# Patient Record
Sex: Female | Born: 1960 | Race: Black or African American | Hispanic: No | Smoking: Current some day smoker
Health system: Southern US, Community
[De-identification: ages and names within clinical notes are randomized; demographics above are authoritative.]

## PROBLEM LIST (undated history)

## (undated) DIAGNOSIS — J309 Allergic rhinitis, unspecified: Secondary | ICD-10-CM

## (undated) DIAGNOSIS — E7800 Pure hypercholesterolemia, unspecified: Secondary | ICD-10-CM

## (undated) DIAGNOSIS — F39 Unspecified mood [affective] disorder: Secondary | ICD-10-CM

## (undated) DIAGNOSIS — F329 Major depressive disorder, single episode, unspecified: Secondary | ICD-10-CM

## (undated) DIAGNOSIS — I10 Essential (primary) hypertension: Secondary | ICD-10-CM

## (undated) DIAGNOSIS — F32A Depression, unspecified: Secondary | ICD-10-CM

## (undated) HISTORY — PX: TUBAL LIGATION: SHX77

## (undated) HISTORY — PX: STERILIZATION: SHX533

## (undated) HISTORY — PX: CHOLECYSTECTOMY: SHX55

---

## 2013-10-26 ENCOUNTER — Emergency Department (HOSPITAL_COMMUNITY): Payer: Medicaid - Out of State

## 2013-10-26 ENCOUNTER — Emergency Department (HOSPITAL_COMMUNITY)
Admission: EM | Admit: 2013-10-26 | Discharge: 2013-10-26 | Disposition: A | Discharge status: Home / Self Care | Payer: Medicaid - Out of State | Attending: Emergency Medicine | Admitting: Emergency Medicine

## 2013-10-26 ENCOUNTER — Other Ambulatory Visit: Payer: Self-pay

## 2013-10-26 ENCOUNTER — Encounter (HOSPITAL_COMMUNITY): Payer: Self-pay | Admitting: Emergency Medicine

## 2013-10-26 DIAGNOSIS — Z720 Tobacco use: Secondary | ICD-10-CM | POA: Diagnosis not present

## 2013-10-26 DIAGNOSIS — E780 Pure hypercholesterolemia: Secondary | ICD-10-CM | POA: Diagnosis not present

## 2013-10-26 DIAGNOSIS — M7989 Other specified soft tissue disorders: Secondary | ICD-10-CM

## 2013-10-26 DIAGNOSIS — R079 Chest pain, unspecified: Secondary | ICD-10-CM | POA: Diagnosis not present

## 2013-10-26 DIAGNOSIS — Z791 Long term (current) use of non-steroidal anti-inflammatories (NSAID): Secondary | ICD-10-CM | POA: Diagnosis not present

## 2013-10-26 DIAGNOSIS — Z79899 Other long term (current) drug therapy: Secondary | ICD-10-CM | POA: Insufficient documentation

## 2013-10-26 DIAGNOSIS — Z7982 Long term (current) use of aspirin: Secondary | ICD-10-CM | POA: Insufficient documentation

## 2013-10-26 DIAGNOSIS — M79605 Pain in left leg: Secondary | ICD-10-CM

## 2013-10-26 DIAGNOSIS — Z7952 Long term (current) use of systemic steroids: Secondary | ICD-10-CM | POA: Diagnosis not present

## 2013-10-26 HISTORY — DX: Pure hypercholesterolemia, unspecified: E78.00

## 2013-10-26 LAB — CBC WITH DIFFERENTIAL/PLATELET
Basophils Absolute: 0 10*3/uL (ref 0.0–0.1)
Basophils Relative: 0 % (ref 0–1)
Eosinophils Absolute: 0.1 10*3/uL (ref 0.0–0.7)
Eosinophils Relative: 2 % (ref 0–5)
HCT: 36.6 % (ref 36.0–46.0)
Hemoglobin: 12.1 g/dL (ref 12.0–15.0)
Lymphocytes Relative: 23 % (ref 12–46)
Lymphs Abs: 1.9 10*3/uL (ref 0.7–4.0)
MCH: 31.5 pg (ref 26.0–34.0)
MCHC: 33.1 g/dL (ref 30.0–36.0)
MCV: 95.3 fL (ref 78.0–100.0)
Monocytes Absolute: 0.5 10*3/uL (ref 0.1–1.0)
Monocytes Relative: 6 % (ref 3–12)
Neutro Abs: 5.7 10*3/uL (ref 1.7–7.7)
Neutrophils Relative %: 69 % (ref 43–77)
Platelets: 188 10*3/uL (ref 150–400)
RBC: 3.84 MIL/uL — ABNORMAL LOW (ref 3.87–5.11)
RDW: 13.7 % (ref 11.5–15.5)
WBC: 8.1 10*3/uL (ref 4.0–10.5)

## 2013-10-26 LAB — BASIC METABOLIC PANEL
Anion gap: 12 (ref 5–15)
BUN: 11 mg/dL (ref 6–23)
CO2: 25 mEq/L (ref 19–32)
Calcium: 9.6 mg/dL (ref 8.4–10.5)
Chloride: 100 mEq/L (ref 96–112)
Creatinine, Ser: 0.68 mg/dL (ref 0.50–1.10)
GFR calc Af Amer: >90 mL/min (ref >90)
GFR calc non Af Amer: >90 mL/min (ref >90)
Glucose, Bld: 97 mg/dL (ref 70–99)
Potassium: 4.4 mEq/L (ref 3.7–5.3)
Sodium: 137 mEq/L (ref 137–147)

## 2013-10-26 LAB — TROPONIN I: Troponin I: <0.3 ng/mL (ref <0.30)

## 2013-10-26 MED ORDER — IBUPROFEN 200 MG PO TABS
600.0000 mg | ORAL_TABLET | Freq: Once | ORAL | Status: AC
  Administered 2013-10-26: 600 mg via ORAL
  Filled 2013-10-26: qty 3

## 2013-10-26 MED ORDER — OXYCODONE-ACETAMINOPHEN 5-325 MG PO TABS
2.0000 | ORAL_TABLET | Freq: Once | ORAL | Status: AC
  Administered 2013-10-26: 2 via ORAL
  Filled 2013-10-26: qty 2

## 2013-10-26 MED ORDER — TRAMADOL HCL 50 MG PO TABS: 50.0000 mg | ORAL_TABLET | Freq: Four times a day (QID) | ORAL | Status: DC | PRN

## 2013-10-26 MED ORDER — OXYCODONE-ACETAMINOPHEN 5-325 MG PO TABS: 1.0000 | ORAL_TABLET | ORAL | Status: DC | PRN

## 2013-10-26 NOTE — ED Notes (Addendum)
Pt c/o intermittent, sharp L side chest pain and LLE swelling/pain x 9 days and intermittent R side abdominal distention/pain x "a while."  Pain score 7/10.  Sts associated symptoms of SOB, dizziness, nausea, and back pain w/ chest pain.  Hx of smoking and high cholesterol.

## 2013-10-26 NOTE — Progress Notes (Signed)
VASCULAR LAB PRELIMINARY  PRELIMINARY  PRELIMINARY  PRELIMINARY  Left lower extremity venous duplex completed.    Preliminary report:  Left:  No evidence of DVT, superficial thrombosis, or Baker's cyst.  Gen Clagg, RVS 10/26/2013, 1:34 PM

## 2013-10-26 NOTE — Discharge Instructions (Signed)

## 2013-10-26 NOTE — ED Notes (Signed)
US at bedside

## 2013-10-26 NOTE — ED Notes (Signed)
AVS explained in detail. No other questions/concerns. Knows not to take extra tylenol/drink/operate heavy machinery with medications. Ambulatory and moving all extremities.

## 2013-10-26 NOTE — ED Notes (Signed)
Hooked up to EKG monitor and SpO2 monitor. A&Ox4. No other questions/concerns. Aware going to x-ray shortly.
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
## 2013-11-06 ENCOUNTER — Emergency Department (HOSPITAL_COMMUNITY)
Admission: EM | Admit: 2013-11-06 | Discharge: 2013-11-06 | Disposition: A | Discharge status: Home / Self Care | Payer: Medicaid - Out of State | Attending: Emergency Medicine | Admitting: Emergency Medicine

## 2013-11-06 ENCOUNTER — Encounter (HOSPITAL_COMMUNITY): Payer: Self-pay

## 2013-11-06 ENCOUNTER — Emergency Department (HOSPITAL_COMMUNITY): Payer: Medicaid - Out of State

## 2013-11-06 DIAGNOSIS — R079 Chest pain, unspecified: Secondary | ICD-10-CM

## 2013-11-06 DIAGNOSIS — Z720 Tobacco use: Secondary | ICD-10-CM | POA: Insufficient documentation

## 2013-11-06 DIAGNOSIS — Z8709 Personal history of other diseases of the respiratory system: Secondary | ICD-10-CM | POA: Insufficient documentation

## 2013-11-06 DIAGNOSIS — R110 Nausea: Secondary | ICD-10-CM | POA: Diagnosis not present

## 2013-11-06 DIAGNOSIS — Z8639 Personal history of other endocrine, nutritional and metabolic disease: Secondary | ICD-10-CM | POA: Insufficient documentation

## 2013-11-06 DIAGNOSIS — M7989 Other specified soft tissue disorders: Secondary | ICD-10-CM | POA: Diagnosis present

## 2013-11-06 DIAGNOSIS — I10 Essential (primary) hypertension: Secondary | ICD-10-CM | POA: Diagnosis not present

## 2013-11-06 DIAGNOSIS — Z8659 Personal history of other mental and behavioral disorders: Secondary | ICD-10-CM | POA: Insufficient documentation

## 2013-11-06 DIAGNOSIS — M25562 Pain in left knee: Secondary | ICD-10-CM | POA: Insufficient documentation

## 2013-11-06 DIAGNOSIS — R1084 Generalized abdominal pain: Secondary | ICD-10-CM | POA: Diagnosis not present

## 2013-11-06 DIAGNOSIS — R7889 Finding of other specified substances, not normally found in blood: Secondary | ICD-10-CM | POA: Diagnosis not present

## 2013-11-06 DIAGNOSIS — R7989 Other specified abnormal findings of blood chemistry: Secondary | ICD-10-CM

## 2013-11-06 DIAGNOSIS — R791 Abnormal coagulation profile: Secondary | ICD-10-CM

## 2013-11-06 HISTORY — DX: Pure hypercholesterolemia, unspecified: E78.00

## 2013-11-06 HISTORY — DX: Unspecified mood (affective) disorder: F39

## 2013-11-06 HISTORY — DX: Depression, unspecified: F32.A

## 2013-11-06 HISTORY — DX: Allergic rhinitis, unspecified: J30.9

## 2013-11-06 HISTORY — DX: Essential (primary) hypertension: I10

## 2013-11-06 HISTORY — DX: Major depressive disorder, single episode, unspecified: F32.9

## 2013-11-06 LAB — CBC WITH DIFFERENTIAL/PLATELET
BASOS ABS: 0 10*3/uL (ref 0.0–0.1)
BASOS PCT: 0 % (ref 0–1)
EOS PCT: 1 % (ref 0–5)
Eosinophils Absolute: 0.1 10*3/uL (ref 0.0–0.7)
HCT: 37 % (ref 36.0–46.0)
Hemoglobin: 11.9 g/dL — ABNORMAL LOW (ref 12.0–15.0)
LYMPHS PCT: 28 % (ref 12–46)
Lymphs Abs: 2.5 10*3/uL (ref 0.7–4.0)
MCH: 30.7 pg (ref 26.0–34.0)
MCHC: 32.2 g/dL (ref 30.0–36.0)
MCV: 95.4 fL (ref 78.0–100.0)
Monocytes Absolute: 0.7 10*3/uL (ref 0.1–1.0)
Monocytes Relative: 8 % (ref 3–12)
Neutro Abs: 5.6 10*3/uL (ref 1.7–7.7)
Neutrophils Relative %: 63 % (ref 43–77)
PLATELETS: 169 10*3/uL (ref 150–400)
RBC: 3.88 MIL/uL (ref 3.87–5.11)
RDW: 13.6 % (ref 11.5–15.5)
WBC: 8.9 10*3/uL (ref 4.0–10.5)

## 2013-11-06 LAB — I-STAT TROPONIN, ED: TROPONIN I, POC: 0 ng/mL (ref 0.00–0.08)

## 2013-11-06 LAB — BASIC METABOLIC PANEL
ANION GAP: 12 (ref 5–15)
BUN: 10 mg/dL (ref 6–23)
CALCIUM: 9.7 mg/dL (ref 8.4–10.5)
CO2: 26 meq/L (ref 19–32)
Chloride: 102 mEq/L (ref 96–112)
Creatinine, Ser: 0.68 mg/dL (ref 0.50–1.10)
GFR calc non Af Amer: >90 mL/min (ref >90)
Glucose, Bld: 88 mg/dL (ref 70–99)
Potassium: 4 mEq/L (ref 3.7–5.3)
Sodium: 140 mEq/L (ref 137–147)

## 2013-11-06 LAB — D-DIMER, QUANTITATIVE: D-Dimer, Quant: 0.61 ug/mL-FEU — ABNORMAL HIGH (ref 0.00–0.48)

## 2013-11-06 MED ORDER — ASPIRIN 81 MG PO CHEW: 324.0000 mg | CHEWABLE_TABLET | Freq: Once | ORAL | Status: DC

## 2013-11-06 MED ORDER — HYDROCODONE-ACETAMINOPHEN 5-325 MG PO TABS: 1.0000 | ORAL_TABLET | Freq: Four times a day (QID) | ORAL | Status: DC | PRN

## 2013-11-06 MED ORDER — ASPIRIN 81 MG PO CHEW
81.0000 mg | CHEWABLE_TABLET | Freq: Once | ORAL | Status: DC
  Filled 2013-11-06: qty 1

## 2013-11-06 MED ORDER — MORPHINE SULFATE 4 MG/ML IJ SOLN
4.0000 mg | Freq: Once | INTRAMUSCULAR | Status: AC
  Administered 2013-11-06: 4 mg via INTRAVENOUS
  Filled 2013-11-06: qty 1

## 2013-11-06 MED ORDER — ASPIRIN 81 MG PO CHEW
243.0000 mg | CHEWABLE_TABLET | Freq: Once | ORAL | Status: AC
  Administered 2013-11-06: 243 mg via ORAL

## 2013-11-06 NOTE — ED Notes (Signed)
Pt states she has swelling to her left leg since 2 weeks ago and felt a sharp pain go up to her chest earlier today.  Pain all the way up to above her knee. No SOB.

## 2013-11-06 NOTE — ED Provider Notes (Signed)
CSN: [id number]     Arrival date & time 11/06/13  1335 History   First MD Initiated Contact with Patient 11/06/13 1631     Chief Complaint  Patient presents with  . Leg Swelling     (Consider location/radiation/quality/duration/timing/severity/associated sxs/prior Treatment) HPI Comments: The patient is a 53 year old female past history of hypertension, bipolar, depression, hypercholesterolemia presents emergency room with chief complaint of left knee pain and swelling for 3 weeks. She also reports associated chest pain since prior to left knee pain and swelling. Patient reports chest pain since last night, all throughout the day. Denies specific aggravating factors including body position, exertion.  She reports pain lasting for seconds, intermittent that radiate from her knee down to her toes, up into her chest. No recent travel, family history or personal history of DVT/PE, cancer, or exogenous estrogen.   The history is provided by the patient. No language interpreter was used.    Past Medical History  Diagnosis Date  . Hypertension   . Hypercholesteremia   . Mood disorder   . Allergic rhinitis   . Depression    Past Surgical History  Procedure Laterality Date  . Sterilization     No family history on file. History  Substance Use Topics  . Smoking status: Current Some Day Smoker  . Smokeless tobacco: Not on file  . Alcohol Use: Yes     Comment: occas   OB History    No data available     Review of Systems  Constitutional: Negative for fever, chills and diaphoresis.  Respiratory: Negative for cough, shortness of breath and wheezing.   Cardiovascular: Positive for chest pain and leg swelling.  Gastrointestinal: Positive for nausea and abdominal pain. Negative for vomiting and diarrhea.       Abdominal pain for months.  Musculoskeletal: Positive for arthralgias.      Allergies  Seroquel and Tramadol  Home Medications   Prior to Admission medications   Not on  File   BP 113/69 mmHg  Pulse 89  Temp(Src) 97.9 F (36.6 C) (Oral)  Resp 19  Ht 5\' 4"  (1.626 m)  Wt 145 lb (65.772 kg)  BMI 24.88 kg/m2  SpO2 97%  LMP 09/30/2013 Physical Exam  Constitutional: She is oriented to person, place, and time. She appears well-developed and well-nourished.  Non-toxic appearance. She does not have a sickly appearance. She does not appear ill. No distress.  HENT:  Head: Normocephalic and atraumatic.  Neck: Neck supple.  Cardiovascular: Normal rate and regular rhythm.   No lower extremity edema, Negative Homans' sign.  Pulmonary/Chest: Effort normal. No respiratory distress. She has no wheezes. She has no rales. She exhibits tenderness.    Patient is able to speak in complete sentences.   Abdominal: Soft. She exhibits no distension and no mass. There is generalized tenderness. There is no rebound and no guarding.  Minimal generalized tenderness.  Musculoskeletal:       Left knee: She exhibits no swelling and no effusion. Tenderness found.  Generalized knee tenderness with palpation.  Neurological: She is alert and oriented to person, place, and time.  Skin: Skin is warm and dry. She is not diaphoretic.  Psychiatric: She has a normal mood and affect. Her behavior is normal.  Nursing note and vitals reviewed.   ED Course  Procedures (including critical care time) Labs Review Labs Reviewed  CBC WITH DIFFERENTIAL - Abnormal; Notable for the following:    Hemoglobin 11.9 (*)    All other components within normal  limits  D-DIMER, QUANTITATIVE - Abnormal; Notable for the following:    D-Dimer, Quant 0.61 (*)    All other components within normal limits  BASIC METABOLIC PANEL  Randolm Idol, ED    Imaging Review Dg Chest 2 View  11/06/2013   CLINICAL DATA:  Chest pain.  EXAM: CHEST  2 VIEW  COMPARISON:  None.  FINDINGS: The heart size and mediastinal contours are within normal limits. Both lungs are clear. The visualized skeletal structures are  unremarkable.  IMPRESSION: No active cardiopulmonary disease.   Electronically Signed   By: Earle Gell M.D.   On: 11/06/2013 17:59     EKG Interpretation   Date/Time:  Tuesday November 06 2013 16:44:49 EST Ventricular Rate:  80 PR Interval:  179 QRS Duration: 82 QT Interval:  345 QTC Calculation: 398 R Axis:   58 Text Interpretation:  Sinus rhythm Minimal ST elevation, inferior leads  normal ST segment. Slight PR depression inferiorly. Borderline EKG.  Confirmed by Johnney Killian, MD, Jeannie Done [phone]) on 11/06/2013 4:50:46 PM      MDM   Final diagnoses:  Chest pain  Left knee pain  Elevated d-dimer   Patient presents with left knee pain and chest pain. Patient reports lower extremity edema, no edema or calf tenderness on exam. Chest pain is reproducible with palpation of chest wall. Doubt ACS. Borderline EKG, negative chest x-ray CBC, BMP without concerning abnormalities. D-dimer was slightly positive, 0.61, ultrasound of lower extremity was negative for a DVT. I doubt PE at this time, chest pain is reproducible with palpation of chest. Patient was 98% on room air with ambulation. Dr. Johnney Killian also evaluated the patient during this encounter and agrees likely musculoskeletal with chest discomfort and knee discomfort reproducible. Plan to have the patient follow up with orthopedist, knee sleeve ordered. Patient is on anti-inflammatory. Advised continued use, Norco for acute pain. Meds given in ED:  Medications  morphine 4 MG/ML injection 4 mg (4 mg Intravenous Given 11/06/13 1710)  aspirin chewable tablet 243 mg (243 mg Oral Given 11/06/13 1712)    New Prescriptions   HYDROCODONE-ACETAMINOPHEN (NORCO/VICODIN) 5-325 MG PER TABLET    Take 1 tablet by mouth every 6 (six) hours as needed for moderate pain or severe pain.    Harvie Heck, PA-C 11/07/13 [phone]

## 2013-11-06 NOTE — Progress Notes (Signed)
*  Preliminary Results* Left lower extremity venous duplex completed. Left lower extremity is negative for deep vein thrombosis. There is no evidence of left Baker's cyst.  11/06/2013 5:55 PM  Maudry Mayhew, RVT, RDCS, RDMS

## 2013-11-06 NOTE — ED Notes (Signed)
Pt O2 was 98% while walking.

## 2013-11-06 NOTE — Discharge Instructions (Signed)
Call for a follow up appointment with a Family or Primary Care Provider.  Call an orthopedic doctor for further evaluation of your knee pain. Return if Symptoms worsen.   Take medication as prescribed.  Continue to take your CELEBREX for your knee pain and chest wall pain.  If you do not take these, you can take Ibuprofen or another NSAIDs. Ice your knee 3 times a day. Elevate above your heart to decrease pain and swelling.

## 2013-11-07 ENCOUNTER — Encounter (HOSPITAL_COMMUNITY): Payer: Self-pay | Admitting: Emergency Medicine

## 2014-04-05 ENCOUNTER — Emergency Department (HOSPITAL_COMMUNITY)
Admission: EM | Admit: 2014-04-05 | Discharge: 2014-04-05 | Disposition: A | Discharge status: Home / Self Care | Payer: Medicaid - Out of State | Attending: Emergency Medicine | Admitting: Emergency Medicine

## 2014-04-05 ENCOUNTER — Emergency Department (HOSPITAL_COMMUNITY): Payer: Medicaid - Out of State

## 2014-04-05 ENCOUNTER — Encounter (HOSPITAL_COMMUNITY): Payer: Self-pay | Admitting: Emergency Medicine

## 2014-04-05 DIAGNOSIS — Z8709 Personal history of other diseases of the respiratory system: Secondary | ICD-10-CM | POA: Insufficient documentation

## 2014-04-05 DIAGNOSIS — I10 Essential (primary) hypertension: Secondary | ICD-10-CM | POA: Diagnosis not present

## 2014-04-05 DIAGNOSIS — E780 Pure hypercholesterolemia: Secondary | ICD-10-CM | POA: Insufficient documentation

## 2014-04-05 DIAGNOSIS — F329 Major depressive disorder, single episode, unspecified: Secondary | ICD-10-CM | POA: Diagnosis not present

## 2014-04-05 DIAGNOSIS — Z79899 Other long term (current) drug therapy: Secondary | ICD-10-CM | POA: Insufficient documentation

## 2014-04-05 DIAGNOSIS — Z791 Long term (current) use of non-steroidal anti-inflammatories (NSAID): Secondary | ICD-10-CM | POA: Insufficient documentation

## 2014-04-05 DIAGNOSIS — Z7982 Long term (current) use of aspirin: Secondary | ICD-10-CM | POA: Insufficient documentation

## 2014-04-05 DIAGNOSIS — K319 Disease of stomach and duodenum, unspecified: Secondary | ICD-10-CM

## 2014-04-05 DIAGNOSIS — Z7951 Long term (current) use of inhaled steroids: Secondary | ICD-10-CM | POA: Diagnosis not present

## 2014-04-05 DIAGNOSIS — K279 Peptic ulcer, site unspecified, unspecified as acute or chronic, without hemorrhage or perforation: Secondary | ICD-10-CM | POA: Diagnosis not present

## 2014-04-05 DIAGNOSIS — Z720 Tobacco use: Secondary | ICD-10-CM | POA: Diagnosis not present

## 2014-04-05 DIAGNOSIS — K30 Functional dyspepsia: Secondary | ICD-10-CM

## 2014-04-05 DIAGNOSIS — R079 Chest pain, unspecified: Secondary | ICD-10-CM | POA: Diagnosis present

## 2014-04-05 LAB — HEPATIC FUNCTION PANEL
ALT: 26 U/L (ref 0–35)
AST: 25 U/L (ref 0–37)
Albumin: 4.1 g/dL (ref 3.5–5.2)
Alkaline Phosphatase: 81 U/L (ref 39–117)
Bilirubin, Direct: <0.1 mg/dL (ref 0.0–0.5)
Total Bilirubin: 0.8 mg/dL (ref 0.3–1.2)
Total Protein: 7.8 g/dL (ref 6.0–8.3)

## 2014-04-05 LAB — CBC
HEMATOCRIT: 37 % (ref 36.0–46.0)
HEMOGLOBIN: 12 g/dL (ref 12.0–15.0)
MCH: 31.2 pg (ref 26.0–34.0)
MCHC: 32.4 g/dL (ref 30.0–36.0)
MCV: 96.1 fL (ref 78.0–100.0)
Platelets: 163 10*3/uL (ref 150–400)
RBC: 3.85 MIL/uL — ABNORMAL LOW (ref 3.87–5.11)
RDW: 13.9 % (ref 11.5–15.5)
WBC: 9.2 10*3/uL (ref 4.0–10.5)

## 2014-04-05 LAB — I-STAT TROPONIN, ED: TROPONIN I, POC: 0 ng/mL (ref 0.00–0.08)

## 2014-04-05 LAB — BASIC METABOLIC PANEL
Anion gap: 11 (ref 5–15)
BUN: 11 mg/dL (ref 6–23)
CO2: 26 mmol/L (ref 19–32)
Calcium: 10.1 mg/dL (ref 8.4–10.5)
Chloride: 104 mmol/L (ref 96–112)
Creatinine, Ser: 0.71 mg/dL (ref 0.50–1.10)
Glucose, Bld: 127 mg/dL — ABNORMAL HIGH (ref 70–99)
POTASSIUM: 3.7 mmol/L (ref 3.5–5.1)
SODIUM: 141 mmol/L (ref 135–145)

## 2014-04-05 LAB — I-STAT BETA HCG BLOOD, ED (MC, WL, AP ONLY): I-stat hCG, quantitative: <5 m[IU]/mL (ref <5)

## 2014-04-05 LAB — LIPASE, BLOOD: Lipase: 41 U/L (ref 11–59)

## 2014-04-05 LAB — BRAIN NATRIURETIC PEPTIDE: B Natriuretic Peptide: 26 pg/mL (ref 0.0–100.0)

## 2014-04-05 MED ORDER — GI COCKTAIL ~~LOC~~
30.0000 mL | Freq: Once | ORAL | Status: AC
  Administered 2014-04-05: 30 mL via ORAL
  Filled 2014-04-05: qty 30

## 2014-04-05 MED ORDER — OMEPRAZOLE 20 MG PO CPDR: 20.0000 mg | DELAYED_RELEASE_CAPSULE | Freq: Every day | ORAL | Status: DC

## 2014-04-05 MED ORDER — IOHEXOL 300 MG/ML  SOLN
100.0000 mL | Freq: Once | INTRAMUSCULAR | Status: AC | PRN
  Administered 2014-04-05: 100 mL via INTRAVENOUS

## 2014-04-05 NOTE — ED Provider Notes (Signed)
  Face-to-face evaluation   History: She has had some pain in her left chest, and left upper quadrant for one week. She also has some right lower quadrant abdominal pain. She complains of gaining weight. She drinks alcohol, fairly heavily.  Physical exam: Alert, calm, cooperative. Upper abdomen is nontender. When evaluated at 20:50. There is no right upper quadrant tenderness or mass.  20:50- all placed to GI, to discuss follow-up for abnormal CT results indicating duct swelling associated with hepatic drainage pathway.  I discussed case with Dr. Carlean Purl, he will see the patient is needed in the office.  Dg Chest 2 View  04/05/2014   CLINICAL DATA:  Left-sided chest pain and night sweats  EXAM: CHEST  2 VIEW  COMPARISON:  October 26, 2013  FINDINGS: Lungs are clear. Heart size and pulmonary vascularity are normal. No adenopathy. No pneumothorax. There is lower thoracic levoscoliosis.  IMPRESSION: No edema or consolidation.   Electronically Signed   By: Lowella Grip III M.D.   On: 04/05/2014 14:22   Ct Abdomen Pelvis W Contrast  04/05/2014   CLINICAL DATA:  Bloating and right-sided abdominal tenderness for 2 weeks. No nausea or vomiting.  EXAM: CT ABDOMEN AND PELVIS WITH CONTRAST  TECHNIQUE: Multidetector CT imaging of the abdomen and pelvis was performed using the standard protocol following bolus administration of intravenous contrast.  CONTRAST:  131mL OMNIPAQUE IOHEXOL 300 MG/ML  SOLN  COMPARISON:  None.  FINDINGS: Lower chest: There is a calcified granuloma in the left lower lobe base. No significant abnormality is evident.  Hepatobiliary: There is mild generalized fatty infiltration of the liver. There is no focal liver lesion. Gallbladder appears unremarkable. There is moderate dilatation of the extrahepatic bile ducts, up to 9 mm, without visible obstructing stone or mass. There also is moderate dilatation of the pancreatic duct up to 5 mm in the pancreatic head and 3 mm in the pancreatic  body.  Pancreas: No parenchymal pancreatic abnormalities are evident. No peripancreatic cysts. No calcifications.  Spleen: Normal  Adrenals/Urinary Tract: The adrenals and kidneys are normal in appearance. There is no urinary calculus evident. There is no hydronephrosis or ureteral dilatation. Collecting systems and ureters appear unremarkable.  Stomach/Bowel: There are normal appearances of the stomach, small bowel and colon. The appendix is normal.  Vascular/Lymphatic: The abdominal aorta is normal in caliber. There is mild atherosclerotic calcification. There is no adenopathy in the abdomen or pelvis.  Reproductive: There is a 1.7 cm fundal fibroid. There are otherwise normal appearances of the uterus and ovaries.  Other: No acute inflammatory changes are evident in the abdomen or pelvis. There is no ascites.  Musculoskeletal: No acute abnormality. There is a small fat containing umbilical hernia.  IMPRESSION: 1. Moderate dilatation of the extrahepatic bile ducts and pancreatic duct without evidence of an obstructing mass or calculus. Consider MRCP or ERCP to exclude a significant lesion. 2. Mild fatty infiltration of the liver. 3. 1.7 cm uterine fundal fibroid 4. Small fat containing umbilical hernia   Electronically Signed   By: Andreas Newport M.D.   On: 04/05/2014 20:03    Medical screening examination/treatment/procedure(s) were conducted as a shared visit with non-physician practitioner(s) and myself.  I personally evaluated the patient during the encounter  Daleen Bo, MD 04/07/14 [phone]

## 2014-04-05 NOTE — Discharge Instructions (Signed)
Please use medication as directed. Monitor for new or worsening signs including dizziness, increased pain, dark tarry stools, changes in skin color. Please follow-up with GI specialist is attached. Please return immediately if new or worsening signs or symptoms present.

## 2014-04-05 NOTE — ED Notes (Signed)
Pt c/o L sided chest pain x 1 week, worsening last night. Pt sts pain is 8/10. Pt sts she also feels the pain down her "entire L side." Pt specifies pain in L neck, L shoulder, L side. Pt denies tenderness to palpation but sts pain is worse when moving her L arm or L leg.

## 2014-04-05 NOTE — ED Provider Notes (Signed)
CSN: [id number]     Arrival date & time 04/05/14  1331 History   None    Chief Complaint  Patient presents with  . Chest Pain    Patient is a 54 y.o. female presenting with chest pain.  Chest Pain Associated symptoms: abdominal pain      54 year old female presents today with left upper quadrant pain. Patient reports for the last week she has experienced intermittent sharp, burning sensation in her left upper quadrant that radiates up into her chest. She reports the pain is exacerbated by foods, supine position. Patient also reports right upper quadrant pain for an undetermined amount of time. Patient reports a history of GERD for which she is not currently taking any medication. Patient also notes that her last normal menstruation was in November; reports episodes of hot and cold,  reports she is sexually active. Patient denies fever, dizziness, headache, shortness of breath, chest pain, lower abdominal pain, lower extremity swelling, change in stool including color. Patient reports a history of smoking since the age of 45, and alcohol abuse reporting 5-6 beers a day.    Past Medical History  Diagnosis Date  . High cholesterol   . Hypertension   . Hypercholesteremia   . Mood disorder   . Allergic rhinitis   . Depression    Past Surgical History  Procedure Laterality Date  . Tubal ligation    . Sterilization     No family history on file. History  Substance Use Topics  . Smoking status: Current Some Day Smoker  . Smokeless tobacco: Not on file  . Alcohol Use: Yes     Comment: occ   OB History    Gravida Para Term Preterm AB TAB SAB Ectopic Multiple Living   0 0 0 0 0 0 0 0       Review of Systems  Gastrointestinal: Positive for abdominal pain.  All other systems reviewed and are negative.   Allergies  Seroquel and Tramadol  Home Medications   Prior to Admission medications   Medication Sig Start Date End Date Taking? Authorizing Provider  aspirin 81 MG tablet Take  81 mg by mouth daily.   Yes Historical Provider, MD  celecoxib (CELEBREX) 200 MG capsule Take 200 mg by mouth 2 (two) times daily.   Yes Historical Provider, MD  Cholecalciferol (VITAMIN D) 2000 UNITS CAPS Take 2,000 Units by mouth daily.   Yes Historical Provider, MD  FLUoxetine (PROZAC) 20 MG capsule Take 1 tablet by mouth daily.   Yes Historical Provider, MD  mometasone (NASONEX) 50 MCG/ACT nasal spray Place 2 sprays into the nose daily.   Yes Historical Provider, MD  omega-3 acid ethyl esters (LOVAZA) 1 G capsule Take 1 g by mouth 4 (four) times daily.   Yes Historical Provider, MD  QUEtiapine (SEROQUEL XR) 50 MG TB24 24 hr tablet Take 50 mg by mouth every evening.   Yes Historical Provider, MD  rosuvastatin (CRESTOR) 40 MG tablet Take 1 tablet by mouth daily.   Yes Historical Provider, MD  HYDROcodone-acetaminophen (NORCO/VICODIN) 5-325 MG per tablet Take 1 tablet by mouth every 6 (six) hours as needed for moderate pain or severe pain. 11/06/13   Harvie Heck, PA-C  oxyCODONE-acetaminophen (PERCOCET/ROXICET) 5-325 MG per tablet Take 1 tablet by mouth every 4 (four) hours as needed for severe pain. 10/26/13   Virgel Manifold, MD  traMADol (ULTRAM) 50 MG tablet Take 1 tablet (50 mg total) by mouth every 6 (six) hours as needed. 10/26/13  Virgel Manifold, MD   BP 131/91 mmHg  Pulse 91  Temp(Src) 98.3 F (36.8 C) (Oral)  Resp 18  SpO2 100%  LMP 11/19/2013 (Approximate) Physical Exam  Constitutional: She is oriented to person, place, and time. She appears well-developed and well-nourished.  HENT:  Head: Normocephalic and atraumatic.  Eyes: Pupils are equal, round, and reactive to light.  Neck: Normal range of motion. Neck supple. No JVD present. No tracheal deviation present. No thyromegaly present.  Cardiovascular: Normal rate, regular rhythm, normal heart sounds and intact distal pulses.  Exam reveals no gallop and no friction rub.   No murmur heard. Pulmonary/Chest: Effort normal and  breath sounds normal. No stridor. No respiratory distress. She has no wheezes. She has no rales. She exhibits no tenderness.  Abdominal: Soft. Bowel sounds are normal. There is no hepatosplenomegaly, splenomegaly or hepatomegaly. There is tenderness in the epigastric area and left upper quadrant. There is no rigidity, no rebound, no guarding, no CVA tenderness, no tenderness at McBurney's point and negative Murphy's sign.  Musculoskeletal: Normal range of motion.  Lymphadenopathy:    She has no cervical adenopathy.  Neurological: She is alert and oriented to person, place, and time. Coordination normal.  Skin: Skin is warm and dry.  Psychiatric: She has a normal mood and affect. Her behavior is normal. Judgment and thought content normal.  Nursing note and vitals reviewed.   ED Course  Procedures (including critical care time) Labs Review Labs Reviewed  CBC - Abnormal; Notable for the following:    RBC 3.85 (*)    All other components within normal limits  BASIC METABOLIC PANEL - Abnormal; Notable for the following:    Glucose, Bld 127 (*)    All other components within normal limits  BRAIN NATRIURETIC PEPTIDE  HEPATIC FUNCTION PANEL  LIPASE, BLOOD  I-STAT TROPOININ, ED  I-STAT BETA HCG BLOOD, ED ([facility], [facility], [facility] ONLY)    Imaging Review Dg Chest 2 View  04/05/2014   CLINICAL DATA:  Left-sided chest pain and night sweats  EXAM: CHEST  2 VIEW  COMPARISON:  October 26, 2013  FINDINGS: Lungs are clear. Heart size and pulmonary vascularity are normal. No adenopathy. No pneumothorax. There is lower thoracic levoscoliosis.  IMPRESSION: No edema or consolidation.   Electronically Signed   By: Lowella Grip III M.D.   On: 04/05/2014 14:22     EKG Interpretation   Date/Time:  Friday April 05 2014 13:41:24 EDT Ventricular Rate:  85 PR Interval:  169 QRS Duration: 80 QT Interval:  352 QTC Calculation: 418 R Axis:   68 Text Interpretation:  Sinus rhythm Probable left atrial enlargement   Borderline T wave abnormalities since last tracing no significant change  Confirmed by Eulis Foster  MD, Vira Agar [phone]) on 04/05/2014 3:53:28 PM      MDM   Final diagnoses:  Peptic disease    Labs: I-STAT beta-hCG, hepatic function panel, lipase, troponin, CBC, BMP, BNP no significant findings  Imaging: DG chest no edema or consolidation   Therapeutics: She had cocktail offered, refused  Assessment: Peptic disease  Plan: Patient's presentation likely peptic disease given her previous history, similar presentation, and the fact that she no longer takes her proton pump inhibitor medication. Patient was discharged home with proton pump inhibitor medication struck to to begin therapy and to contact her primary care provider inform them of her recent ED visit and all pertinent information. She is advised to schedule follow-up appointment as upcoming week. Patient was given return instructions and  concerning signs or symptoms to monitor for. Patient's care was shared with Dr. Eulis Foster who contacted GI for advice on management of abnormal CT findings as noted above. He spoke with Dr. Carlean Purl who recommended she follow-up this week outpatient for further evaluation and management. All this information was shared with the patient who understood and agreed to follow-up. She had no further questions at time of discharge.       Okey Regal, PA-C 04/06/14 [place], MD 04/07/14 [phone]

## 2014-04-05 NOTE — ED Notes (Signed)
Patient transported to CT 

## 2014-04-05 NOTE — ED Notes (Signed)
Per pt, states chest pain for a week-got worse today-radiated to arms and legs

## 2014-07-25 ENCOUNTER — Encounter (HOSPITAL_COMMUNITY): Payer: Self-pay | Admitting: Emergency Medicine

## 2014-07-25 ENCOUNTER — Emergency Department (HOSPITAL_COMMUNITY)
Admission: EM | Admit: 2014-07-25 | Discharge: 2014-07-25 | Disposition: A | Discharge status: Home / Self Care | Payer: Medicaid - Out of State | Attending: Emergency Medicine | Admitting: Emergency Medicine

## 2014-07-25 DIAGNOSIS — Y9289 Other specified places as the place of occurrence of the external cause: Secondary | ICD-10-CM | POA: Insufficient documentation

## 2014-07-25 DIAGNOSIS — Y998 Other external cause status: Secondary | ICD-10-CM | POA: Insufficient documentation

## 2014-07-25 DIAGNOSIS — W57XXXA Bitten or stung by nonvenomous insect and other nonvenomous arthropods, initial encounter: Secondary | ICD-10-CM | POA: Insufficient documentation

## 2014-07-25 DIAGNOSIS — Z720 Tobacco use: Secondary | ICD-10-CM | POA: Insufficient documentation

## 2014-07-25 DIAGNOSIS — Z7982 Long term (current) use of aspirin: Secondary | ICD-10-CM | POA: Insufficient documentation

## 2014-07-25 DIAGNOSIS — Z79899 Other long term (current) drug therapy: Secondary | ICD-10-CM | POA: Insufficient documentation

## 2014-07-25 DIAGNOSIS — Y9389 Activity, other specified: Secondary | ICD-10-CM | POA: Insufficient documentation

## 2014-07-25 DIAGNOSIS — I10 Essential (primary) hypertension: Secondary | ICD-10-CM | POA: Insufficient documentation

## 2014-07-25 DIAGNOSIS — Z7951 Long term (current) use of inhaled steroids: Secondary | ICD-10-CM | POA: Insufficient documentation

## 2014-07-25 DIAGNOSIS — E780 Pure hypercholesterolemia: Secondary | ICD-10-CM | POA: Insufficient documentation

## 2014-07-25 DIAGNOSIS — S50861A Insect bite (nonvenomous) of right forearm, initial encounter: Secondary | ICD-10-CM | POA: Insufficient documentation

## 2014-07-25 DIAGNOSIS — F329 Major depressive disorder, single episode, unspecified: Secondary | ICD-10-CM | POA: Insufficient documentation

## 2014-07-25 NOTE — Discharge Instructions (Signed)
Take Benadryl or Claritin for several days to treat the allergic reaction to the insect bite. Also use a cool compress or ice treatment to the area affected, 3 or 4 times a day for several days. Return here, if needed, for problems.  Insect Bite Mosquitoes, flies, fleas, bedbugs, and many other insects can bite. Insect bites are different from insect stings. A sting is when venom is injected into the skin. Some insect bites can transmit infectious diseases. SYMPTOMS  Insect bites usually turn red, swell, and itch for 2 to 4 days. They often go away on their own. TREATMENT  Your caregiver may prescribe antibiotic medicines if a bacterial infection develops in the bite. HOME CARE INSTRUCTIONS  Do not scratch the bite area.  Keep the bite area clean and dry. Wash the bite area thoroughly with soap and water.  Put ice or cool compresses on the bite area.  Put ice in a plastic bag.  Place a towel between your skin and the bag.  Leave the ice on for 20 minutes, 4 times a day for the first 2 to 3 days, or as directed.  You may apply a baking soda paste, cortisone cream, or calamine lotion to the bite area as directed by your caregiver. This can help reduce itching and swelling.  Only take over-the-counter or prescription medicines as directed by your caregiver.  If you are given antibiotics, take them as directed. Finish them even if you start to feel better. You may need a tetanus shot if:  You cannot remember when you had your last tetanus shot.  You have never had a tetanus shot.  The injury broke your skin. If you get a tetanus shot, your arm may swell, get red, and feel warm to the touch. This is common and not a problem. If you need a tetanus shot and you choose not to have one, there is a rare chance of getting tetanus. Sickness from tetanus can be serious. SEEK IMMEDIATE MEDICAL CARE IF:   You have increased pain, redness, or swelling in the bite area.  You see a red line on  the skin coming from the bite.  You have a fever.  You have joint pain.  You have a headache or neck pain.  You have unusual weakness.  You have a rash.  You have chest pain or shortness of breath.  You have abdominal pain, nausea, or vomiting.  You feel unusually tired or sleepy. MAKE SURE YOU:   Understand these instructions.  Will watch your condition.  Will get help right away if you are not doing well or get worse. Document Released: 01/29/2004 Document Revised: 03/15/2011 Document Reviewed: 07/22/2010 [facility] Patient Information 2015 [place], [place]. This information is not intended to replace advice given to you by your health care provider. Make sure you discuss any questions you have with your health care provider.

## 2014-07-25 NOTE — ED Notes (Signed)
Pt c/o possible insect bite on her right arm that happened yesterday while sleeping on the couch.  Pt states that reddened area has gotten bigger, painful and itches.

## 2014-07-25 NOTE — ED Provider Notes (Signed)
CSN: [id number]     Arrival date & time 07/25/14  1221 History  This chart was scribed for Daleen Bo, MD by Steva Colder, ED Scribe. The patient was seen in room WTR7/WTR7 at 12:52 PM.     Chief Complaint  Patient presents with  . Insect Bite     The history is provided by the patient. No language interpreter was used.    Mckenzie Whitney is a 54 y.o. female with a medical hx of HTN who presents to the Emergency Department complaining of insect bite onset yesterday. Pt notes that she is unsure of where the initial bite was but, she scratched the area and it enlarged since yesterday. Pt is having associated symptoms of right sided myalgias and redness to the right forearm. She denies SOB and any other symptoms. There are no other known modifying factors.  Past Medical History  Diagnosis Date  . High cholesterol   . Hypertension   . Hypercholesteremia   . Mood disorder   . Allergic rhinitis   . Depression    Past Surgical History  Procedure Laterality Date  . Tubal ligation    . Sterilization    . Cholecystectomy     No family history on file. History  Substance Use Topics  . Smoking status: Current Some Day Smoker  . Smokeless tobacco: Not on file  . Alcohol Use: Yes     Comment: occ   OB History    Gravida Para Term Preterm AB TAB SAB Ectopic Multiple Living   0 0 0 0 0 0 0 0       Review of Systems  Respiratory: Negative for shortness of breath.   Musculoskeletal: Positive for myalgias (right sided).  Skin: Positive for color change.  All other systems reviewed and are negative.     Allergies  Seroquel and Tramadol  Home Medications   Prior to Admission medications   Medication Sig Start Date End Date Taking? Authorizing Provider  aspirin 81 MG tablet Take 81 mg by mouth daily.   Yes Historical Provider, MD  celecoxib (CELEBREX) 200 MG capsule Take 200 mg by mouth 2 (two) times daily.   Yes Historical Provider, MD  Cholecalciferol (VITAMIN D) 2000 UNITS  CAPS Take 2,000 Units by mouth daily.   Yes Historical Provider, MD  FLUoxetine (PROZAC) 20 MG capsule Take 20 mg by mouth daily.    Yes Historical Provider, MD  mometasone (NASONEX) 50 MCG/ACT nasal spray Place 2 sprays into the nose daily.   Yes Historical Provider, MD  omega-3 acid ethyl esters (LOVAZA) 1 G capsule Take 1 g by mouth 4 (four) times daily.   Yes Historical Provider, MD  omeprazole (PRILOSEC) 20 MG capsule Take 1 capsule (20 mg total) by mouth daily. 04/05/14  Yes Jeffrey Hedges, PA-C  QUEtiapine (SEROQUEL XR) 50 MG TB24 24 hr tablet Take 50 mg by mouth daily as needed (She dosent like teh way she makes her feel so she dosent tatke it everyday).    Yes Historical Provider, MD  HYDROcodone-acetaminophen (NORCO/VICODIN) 5-325 MG per tablet Take 1 tablet by mouth every 6 (six) hours as needed for moderate pain or severe pain. Patient not taking: Reported on 07/25/2014 11/06/13   Harvie Heck, PA-C  oxyCODONE-acetaminophen (PERCOCET/ROXICET) 5-325 MG per tablet Take 1 tablet by mouth every 4 (four) hours as needed for severe pain. Patient not taking: Reported on 07/25/2014 10/26/13   Virgel Manifold, MD  rosuvastatin (CRESTOR) 40 MG tablet Take 40 mg by mouth  daily.     Historical Provider, MD  traMADol (ULTRAM) 50 MG tablet Take 1 tablet (50 mg total) by mouth every 6 (six) hours as needed. Patient not taking: Reported on 07/25/2014 10/26/13   Virgel Manifold, MD   BP 138/87 mmHg  Pulse 71  Temp(Src) 98.8 F (37.1 C) (Oral)  Resp 16  SpO2 96% Physical Exam  Constitutional: She is oriented to person, place, and time. She appears well-developed and well-nourished.  HENT:  Head: Normocephalic and atraumatic.  Eyes: Conjunctivae and EOM are normal. Pupils are equal, round, and reactive to light.  Neck: Normal range of motion and phonation normal. Neck supple.  Cardiovascular: Normal rate and regular rhythm.   Pulmonary/Chest: Effort normal and breath sounds normal. She exhibits no  tenderness.  Abdominal: Soft. She exhibits no distension. There is no tenderness. There is no guarding.  Musculoskeletal: Normal range of motion.  Neurological: She is alert and oriented to person, place, and time. She exhibits normal muscle tone.  Skin: Skin is warm and dry.  Small puncture on the proximal dorsal right forearmconsistent of an insect bite with surrounding erythema and induration. No associated fluctuance or drainage.   Psychiatric: She has a normal mood and affect. Her behavior is normal. Judgment and thought content normal.  Nursing note and vitals reviewed.   ED Course  Procedures (including critical care time)  Filed Vitals:   07/25/14 1225 07/25/14 1319  BP: 138/87   Pulse: 76 71  Temp: 98.8 F (37.1 C)   TempSrc: Oral   Resp: 16   SpO2: 98% 96%    No orders of the defined types were placed in this encounter.    COORDINATION OF CARE: 12:54 PM-Discussed treatment plan which includes anti-histamine Rx and cool compress with pt at bedside and pt agreed to plan.    Labs Review Labs Reviewed - No data to display  Imaging Review No results found.   EKG Interpretation None      MDM   Final diagnoses:  Insect bite   Apparent insect bite with local allergic reaction.Symptom onset, over 24 hours of time. No evidence for bacterial infection, or systemic allergic response.  Nursing Notes Reviewed/ Care Coordinated Applicable Imaging Reviewed Interpretation of Laboratory Data incorporated into ED treatment  The patient appears reasonably screened and/or stabilized for discharge and I doubt any other medical condition or other [facility] requiring further screening, evaluation, or treatment in the ED at this time prior to discharge.  Plan: Home Medications- Benadryl prn itching and swelling; Home Treatments- rest; return here if the recommended treatment, does not improve the symptoms; Recommended follow up- Return prn   I personally performed the services  described in this documentation, which was scribed in my presence. The recorded information has been reviewed and is accurate.     Daleen Bo, MD 07/25/14 [phone]

## 2016-02-21 IMAGING — CR DG CHEST 2V
2 series · 2 of 2 positions shown · non-contrast
Comparison: October 26, 2013

CLINICAL DATA: Left-sided chest pain and night sweats

EXAM:
CHEST  2 VIEW

[w chest pa]
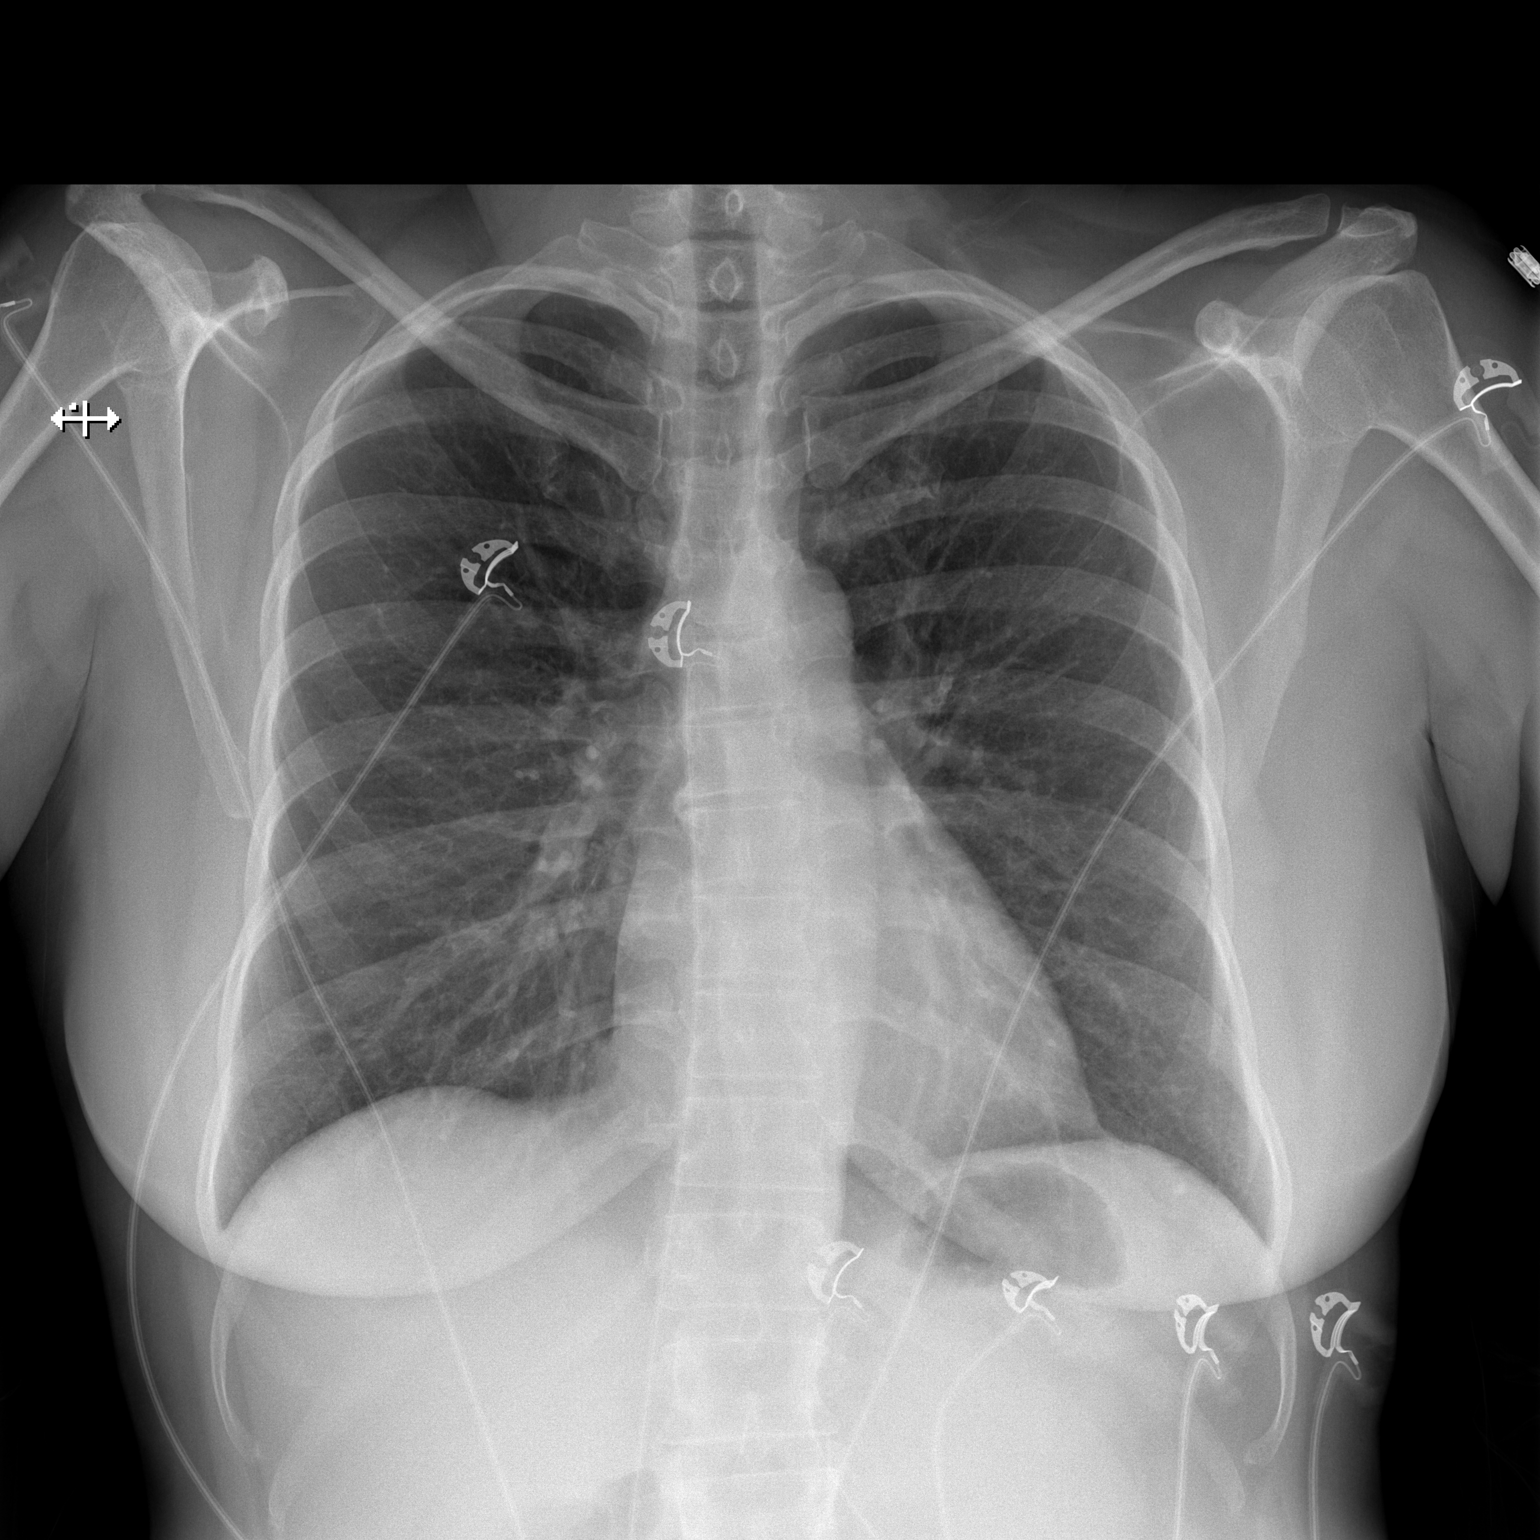

[w chest lat]
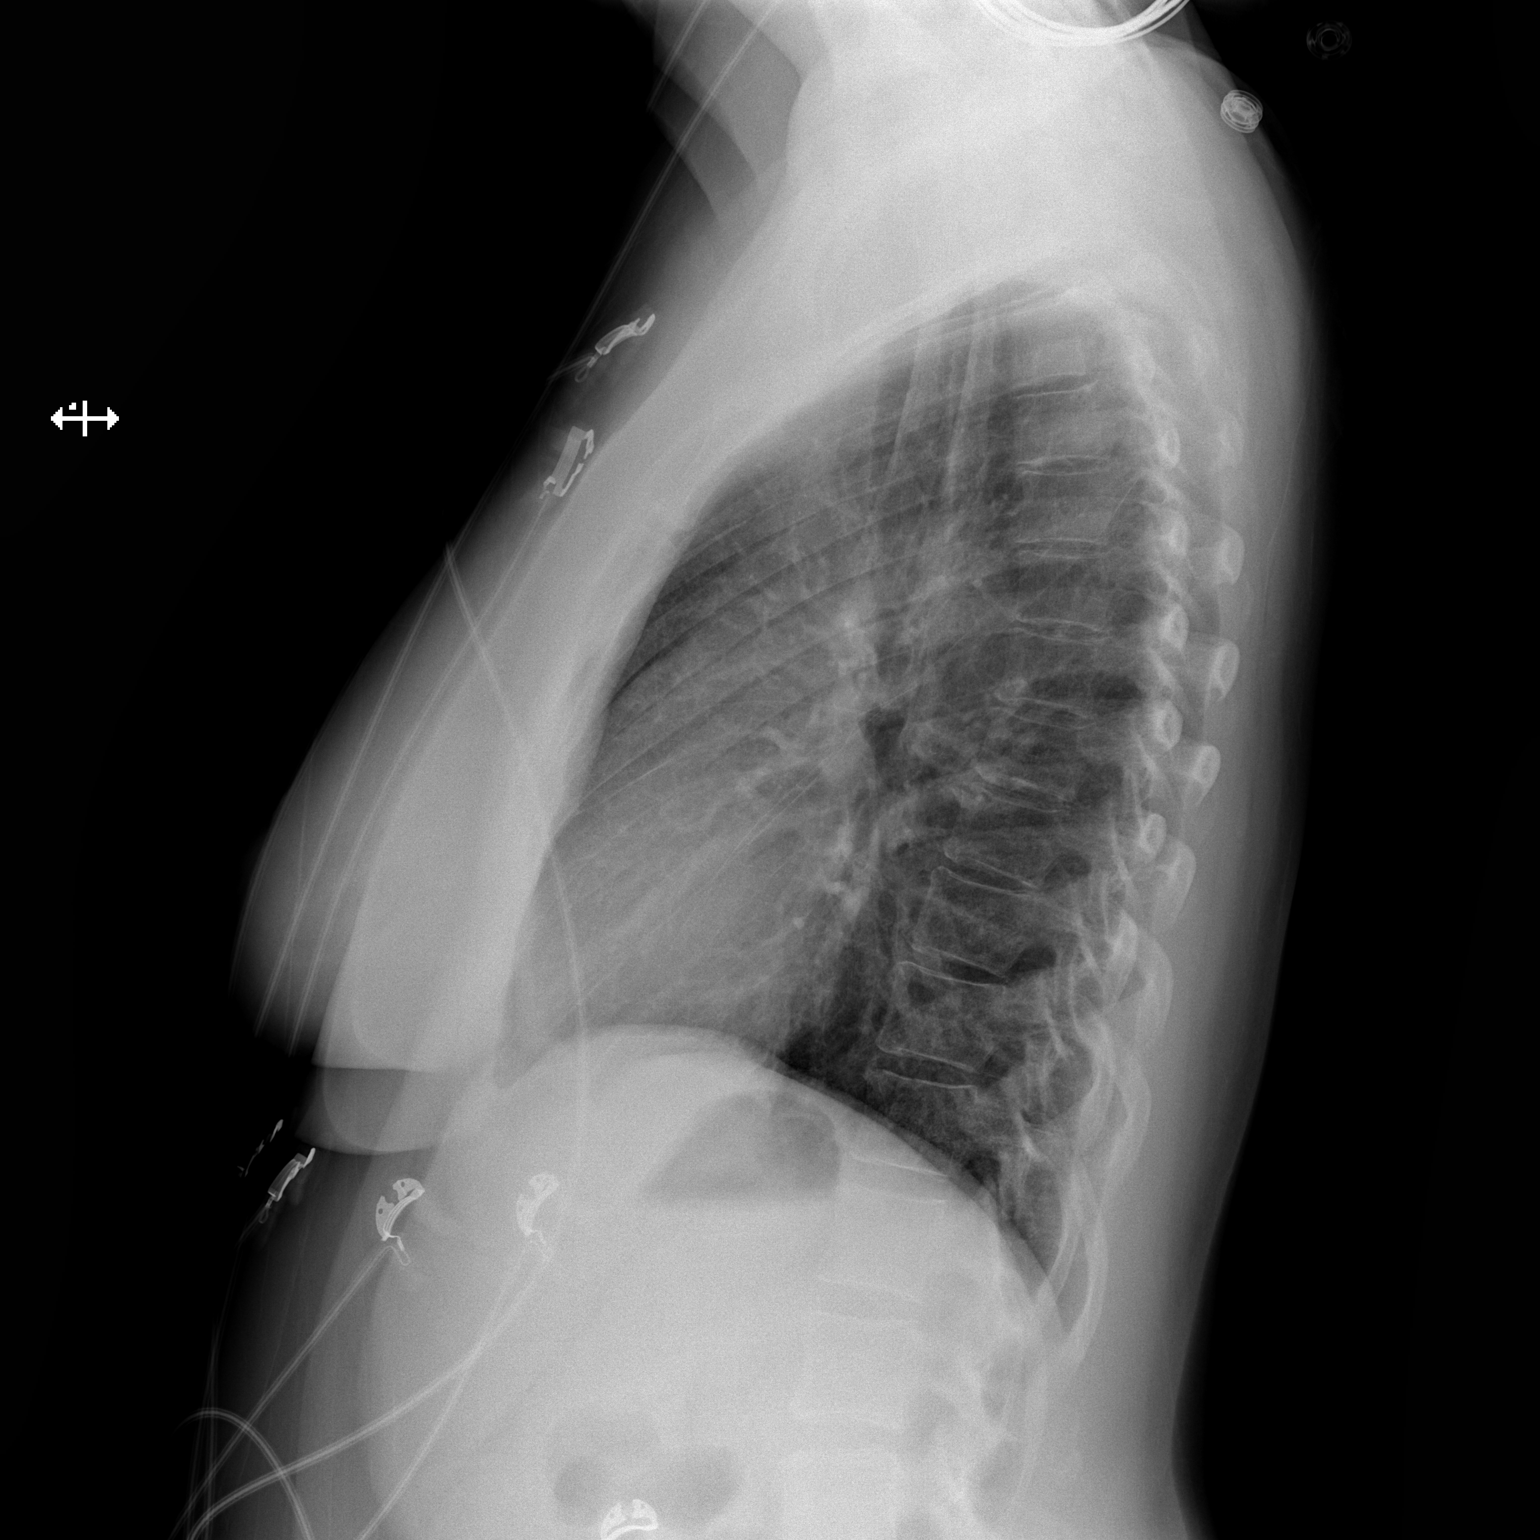

[2 of 2 positions shown; findings below may reference images not displayed]

FINDINGS: Lungs are clear. Heart size and pulmonary vascularity are normal. No
adenopathy. No pneumothorax. There is lower thoracic levoscoliosis.
IMPRESSION: No edema or consolidation.

## 2016-02-26 ENCOUNTER — Encounter (HOSPITAL_COMMUNITY): Payer: Self-pay | Admitting: Emergency Medicine

## 2016-02-26 ENCOUNTER — Emergency Department (HOSPITAL_COMMUNITY)
Admission: EM | Admit: 2016-02-26 | Discharge: 2016-02-26 | Disposition: A | Discharge status: Home / Self Care | Payer: Medicaid Other | Attending: Emergency Medicine | Admitting: Emergency Medicine

## 2016-02-26 ENCOUNTER — Emergency Department (HOSPITAL_COMMUNITY): Payer: Medicaid Other

## 2016-02-26 DIAGNOSIS — F17200 Nicotine dependence, unspecified, uncomplicated: Secondary | ICD-10-CM | POA: Insufficient documentation

## 2016-02-26 DIAGNOSIS — R109 Unspecified abdominal pain: Secondary | ICD-10-CM

## 2016-02-26 DIAGNOSIS — Z79899 Other long term (current) drug therapy: Secondary | ICD-10-CM | POA: Insufficient documentation

## 2016-02-26 DIAGNOSIS — R1084 Generalized abdominal pain: Secondary | ICD-10-CM | POA: Diagnosis not present

## 2016-02-26 DIAGNOSIS — Z7982 Long term (current) use of aspirin: Secondary | ICD-10-CM | POA: Diagnosis not present

## 2016-02-26 DIAGNOSIS — I10 Essential (primary) hypertension: Secondary | ICD-10-CM | POA: Diagnosis not present

## 2016-02-26 DIAGNOSIS — R197 Diarrhea, unspecified: Secondary | ICD-10-CM | POA: Diagnosis not present

## 2016-02-26 LAB — COMPREHENSIVE METABOLIC PANEL
ALK PHOS: 79 U/L (ref 38–126)
ALT: 24 U/L (ref 14–54)
ANION GAP: 5 (ref 5–15)
AST: 22 U/L (ref 15–41)
Albumin: 4.5 g/dL (ref 3.5–5.0)
BUN: 8 mg/dL (ref 6–20)
CALCIUM: 10.3 mg/dL (ref 8.9–10.3)
CO2: 29 mmol/L (ref 22–32)
Chloride: 106 mmol/L (ref 101–111)
Creatinine, Ser: 0.74 mg/dL (ref 0.44–1.00)
GFR calc Af Amer: >60 mL/min (ref >60)
GFR calc non Af Amer: >60 mL/min (ref >60)
GLUCOSE: 104 mg/dL — AB (ref 65–99)
Potassium: 3.8 mmol/L (ref 3.5–5.1)
SODIUM: 140 mmol/L (ref 135–145)
Total Bilirubin: 0.4 mg/dL (ref 0.3–1.2)
Total Protein: 8.4 g/dL — ABNORMAL HIGH (ref 6.5–8.1)

## 2016-02-26 LAB — URINALYSIS, ROUTINE W REFLEX MICROSCOPIC
BACTERIA UA: NONE SEEN
Bilirubin Urine: NEGATIVE
Glucose, UA: NEGATIVE mg/dL
KETONES UR: NEGATIVE mg/dL
Leukocytes, UA: NEGATIVE
Nitrite: NEGATIVE
PROTEIN: NEGATIVE mg/dL
Specific Gravity, Urine: 1.015 (ref 1.005–1.030)
pH: 6 (ref 5.0–8.0)

## 2016-02-26 LAB — CBC
HEMATOCRIT: 38.8 % (ref 36.0–46.0)
HEMOGLOBIN: 12.8 g/dL (ref 12.0–15.0)
MCH: 30.9 pg (ref 26.0–34.0)
MCHC: 33 g/dL (ref 30.0–36.0)
MCV: 93.7 fL (ref 78.0–100.0)
Platelets: 200 10*3/uL (ref 150–400)
RBC: 4.14 MIL/uL (ref 3.87–5.11)
RDW: 13.7 % (ref 11.5–15.5)
WBC: 9.8 10*3/uL (ref 4.0–10.5)

## 2016-02-26 LAB — LIPASE, BLOOD: LIPASE: 36 U/L (ref 11–51)

## 2016-02-26 MED ORDER — IOPAMIDOL (ISOVUE-300) INJECTION 61%
INTRAVENOUS | Status: AC
  Filled 2016-02-26: qty 100

## 2016-02-26 MED ORDER — ONDANSETRON HCL 4 MG PO TABS: 4.0000 mg | ORAL_TABLET | Freq: Three times a day (TID) | ORAL | 0 refills | Status: AC | PRN

## 2016-02-26 MED ORDER — ONDANSETRON HCL 4 MG/2ML IJ SOLN
4.0000 mg | Freq: Once | INTRAMUSCULAR | Status: AC
  Administered 2016-02-26: 4 mg via INTRAVENOUS
  Filled 2016-02-26: qty 2

## 2016-02-26 MED ORDER — SODIUM CHLORIDE 0.9 % IJ SOLN
INTRAMUSCULAR | Status: AC
  Filled 2016-02-26: qty 50

## 2016-02-26 MED ORDER — LOPERAMIDE HCL 2 MG PO CAPS: 2.0000 mg | ORAL_CAPSULE | Freq: Four times a day (QID) | ORAL | 0 refills | Status: AC | PRN

## 2016-02-26 MED ORDER — MORPHINE SULFATE (PF) 4 MG/ML IV SOLN
4.0000 mg | Freq: Once | INTRAVENOUS | Status: AC
  Administered 2016-02-26: 4 mg via INTRAVENOUS
  Filled 2016-02-26: qty 1

## 2016-02-26 MED ORDER — IOPAMIDOL (ISOVUE-300) INJECTION 61%
100.0000 mL | Freq: Once | INTRAVENOUS | Status: AC | PRN
  Administered 2016-02-26: 100 mL via INTRAVENOUS

## 2016-02-26 MED ORDER — DICYCLOMINE HCL 20 MG PO TABS: 20.0000 mg | ORAL_TABLET | Freq: Three times a day (TID) | ORAL | 0 refills | Status: AC

## 2016-02-26 NOTE — ED Provider Notes (Signed)
[place] DEPT Provider Note   CSN: [id number] Arrival date & time: 02/26/16  1105     History   Chief Complaint Chief Complaint  Patient presents with  . Abdominal Pain    HPI Mckenzie Whitney is a 56 y.o. female.  HPI   52 old female with history of hypertension, hyperlipidemia, mood disorder, who presents with generalized abdominal cramping. The patient states her symptoms started 2 days ago with mild, generalized, aching and gnawing abdominal pain. Earlier today, her pain worsened and she developed several episodes of loose, nonbloody, nonbilious emesis as well as diarrhea. She does have known sick contacts. No fevers. No blood in her bowel movements. She states the pain seems to start on the right side of her abdomen and then moved to the left side then down followed by an episode of diarrhea. She has not started any recent medications. She has not tried anything for this.  Past Medical History:  Diagnosis Date  . Allergic rhinitis   . Depression   . High cholesterol   . Hypercholesteremia   . Hypertension   . Mood disorder ([place])     There are no active problems to display for this patient.   Past Surgical History:  Procedure Laterality Date  . CHOLECYSTECTOMY    . STERILIZATION    . TUBAL LIGATION      OB History    Gravida Para Term Preterm AB Living   0 0 0 0 0     SAB TAB Ectopic Multiple Live Births   0 0 0           Home Medications    Prior to Admission medications   Medication Sig Start Date End Date Taking? Authorizing Provider  aspirin EC 81 MG tablet Take 81 mg by mouth every evening.   Yes Historical Provider, MD  celecoxib (CELEBREX) 200 MG capsule Take 200 mg by mouth 2 (two) times daily as needed for mild pain or moderate pain.    Yes Historical Provider, MD  cholecalciferol (VITAMIN D) 1000 units tablet Take 2,000 Units by mouth daily.   Yes Historical Provider, MD  FLUoxetine (PROZAC) 20 MG capsule Take 20 mg by mouth daily as needed  (when she feels like she needs it).    Yes Historical Provider, MD  mometasone (NASONEX) 50 MCG/ACT nasal spray Place 2 sprays into the nose daily.   Yes Historical Provider, MD  omega-3 acid ethyl esters (LOVAZA) 1 G capsule Take 1 g by mouth 4 (four) times daily.   Yes Historical Provider, MD  dicyclomine (BENTYL) 20 MG tablet Take 1 tablet (20 mg total) by mouth 4 (four) times daily -  before meals and at bedtime. 02/26/16 03/04/16  Duffy Bruce, MD  loperamide (IMODIUM) 2 MG capsule Take 1 capsule (2 mg total) by mouth 4 (four) times daily as needed for diarrhea or loose stools. 02/26/16   Duffy Bruce, MD  ondansetron (ZOFRAN) 4 MG tablet Take 1 tablet (4 mg total) by mouth every 8 (eight) hours as needed for nausea or vomiting. 02/26/16   Duffy Bruce, MD    Family History No family history on file.  Social History Social History  Substance Use Topics  . Smoking status: Current Some Day Smoker  . Smokeless tobacco: Not on file  . Alcohol use Yes     Comment: occ     Allergies   Seroquel [quetiapine fumarate] and Tramadol   Review of Systems Review of Systems  Constitutional: Positive for fatigue.  Negative for chills and fever.  HENT: Negative for congestion, rhinorrhea and sore throat.   Eyes: Negative for visual disturbance.  Respiratory: Negative for cough, shortness of breath and wheezing.   Cardiovascular: Negative for chest pain and leg swelling.  Gastrointestinal: Positive for abdominal pain and diarrhea. Negative for nausea and vomiting.  Genitourinary: Negative for dysuria, flank pain, vaginal bleeding and vaginal discharge.  Musculoskeletal: Negative for neck pain.  Skin: Negative for rash.  Allergic/Immunologic: Negative for immunocompromised state.  Neurological: Negative for syncope and headaches.  Hematological: Does not bruise/bleed easily.  All other systems reviewed and are negative.    Physical Exam Updated Vital Signs BP 119/78   Pulse 61   Temp  98.2 F (36.8 C) (Oral)   Resp 16   Ht 5\' 4"  (1.626 m)   Wt 154 lb (69.9 kg)   SpO2 99%   BMI 26.43 kg/m   Physical Exam  Constitutional: She is oriented to person, place, and time. She appears well-developed and well-nourished. No distress.  HENT:  Head: Normocephalic and atraumatic.  Eyes: Conjunctivae are normal.  Neck: Neck supple.  Cardiovascular: Normal rate, regular rhythm and normal heart sounds.  Exam reveals no friction rub.   No murmur heard. Pulmonary/Chest: Effort normal and breath sounds normal. No respiratory distress. She has no wheezes. She has no rales.  Abdominal: She exhibits no distension. Bowel sounds are increased. There is generalized tenderness. There is no rigidity, no rebound, no guarding, no CVA tenderness and negative Murphy's sign.  Musculoskeletal: She exhibits no edema.  Neurological: She is alert and oriented to person, place, and time. She exhibits normal muscle tone.  Skin: Skin is warm. Capillary refill takes less than 2 seconds.  Psychiatric: She has a normal mood and affect.  Nursing note and vitals reviewed.    ED Treatments / Results  Labs (all labs ordered are listed, but only abnormal results are displayed) Labs Reviewed  COMPREHENSIVE METABOLIC PANEL - Abnormal; Notable for the following:       Result Value   Glucose, Bld 104 (*)    Total Protein 8.4 (*)    All other components within normal limits  URINALYSIS, ROUTINE W REFLEX MICROSCOPIC - Abnormal; Notable for the following:    Hgb urine dipstick MODERATE (*)    Squamous Epithelial / LPF 0-5 (*)    All other components within normal limits  LIPASE, BLOOD  CBC    EKG  EKG Interpretation None       Radiology Ct Abdomen Pelvis W Contrast  Result Date: 02/26/2016 CLINICAL DATA:  56 year old female with 2 days of abdominal pain nausea and some diarrhea. Left lower quadrant pain. Initial encounter. EXAM: CT ABDOMEN AND PELVIS WITH CONTRAST TECHNIQUE: Multidetector CT  imaging of the abdomen and pelvis was performed using the standard protocol following bolus administration of intravenous contrast. CONTRAST:  110mL ISOVUE-300 IOPAMIDOL (ISOVUE-300) INJECTION 61% COMPARISON:  CT Abdomen and Pelvis 04/05/2014. FINDINGS: Lower chest: Mildly lower lung volumes with increased dependent lung base atelectasis. No pericardial or pleural effusion. Hepatobiliary: Surgically absent gallbladder. Chronic hepatic steatosis. Stable intra and extrahepatic biliary tree since 2016. Pancreas: Stable mild prominence of the main pancreatic duct and otherwise negative. Spleen: Negative. Adrenals/Urinary Tract: Normal adrenal glands. Bilateral renal enhancement and contrast excretion is normal. Unremarkable urinary bladder. No hydroureter or periureteral stranding is evident. No abdominal free fluid. Stomach/Bowel: Decompressed rectum and sigmoid colon. Mild sigmoid redundancy. Moderate diverticulosis of the proximal sigmoid colon, continuing into the distal descending colon, but  no active inflammation identified. Occasional descending colon diverticula elsewhere. Fluid-filled but nondilated splenic flexure and distal transverse colon. Occasional transverse colon diverticula without active inflammation. Fluid-filled but nondilated right colon. Normal appendix and terminal ileum. No dilated small bowel. Largely decompressed stomach and duodenum. Vascular/Lymphatic: Aortoiliac calcified atherosclerosis noted. Major arterial structures in the abdomen and pelvis are patent. Portal venous system is patent. No lymphadenopathy. Reproductive: Negative.  Numerous parametrial phleboliths. Other: No pelvic free fluid. Musculoskeletal: No acute osseous abnormality identified. IMPRESSION: 1. Fluid in the large bowel compatible with diarrhea but no focal bowel inflammation or inflammatory process identified. Diverticulosis, most pronounced in the sigmoid colon. 2.  Calcified aortic atherosclerosis. 3. Chronic hepatic  steatosis. Electronically Signed   By: Genevie Ann M.D.   On: 02/26/2016 13:33    Procedures Procedures (including critical care time)  Medications Ordered in ED Medications  morphine 4 MG/ML injection 4 mg (4 mg Intravenous Given 02/26/16 1220)  ondansetron (ZOFRAN) injection 4 mg (4 mg Intravenous Given 02/26/16 1220)  iopamidol (ISOVUE-300) 61 % injection 100 mL (100 mLs Intravenous Contrast Given 02/26/16 1309)     Initial Impression / Assessment and Plan / ED Course  I have reviewed the triage vital signs and the nursing notes.  Pertinent labs & imaging results that were available during my care of the patient were reviewed by me and considered in my medical decision making (see chart for details).     56 year old female with past medical history as above who presents with crampy abdominal pain and diarrhea. On arrival, vital signs are stable. Given her abdominal tenderness, CT scan obtained which shows diarrheal illness but no evidence of significant diverticulitis or other intra-abdominal emergency. Her lab work is otherwise reassuring and pain is controlled in the ED. She is tolerating by mouth intake. Will place the patient on supportive care, given antiemetics and antidiarrheals, and discharge home.  Final Clinical Impressions(s) / ED Diagnoses   Final diagnoses:  Diarrhea of presumed infectious origin  Abdominal cramping    New Prescriptions Discharge Medication List as of 02/26/2016  2:43 PM    START taking these medications   Details  dicyclomine (BENTYL) 20 MG tablet Take 1 tablet (20 mg total) by mouth 4 (four) times daily -  before meals and at bedtime., Starting Thu 02/26/2016, Until Thu 03/04/2016, Print    loperamide (IMODIUM) 2 MG capsule Take 1 capsule (2 mg total) by mouth 4 (four) times daily as needed for diarrhea or loose stools., Starting Thu 02/26/2016, Print    ondansetron (ZOFRAN) 4 MG tablet Take 1 tablet (4 mg total) by mouth every 8 (eight) hours as needed  for nausea or vomiting., Starting Thu 02/26/2016, Print         Duffy Bruce, MD 02/26/16 2001

## 2016-02-26 NOTE — ED Notes (Signed)
Discharge instructions, follow up care, and rx x3 reviewed with patient. Patient verbalized understanding. 

## 2016-02-26 NOTE — ED Triage Notes (Addendum)
Patient c/o generalized abdominal pain and cramping with nausea and 2 episodes of diarrhea x2 days. Denies vomiting, chest pain and SOB.

## 2016-06-17 ENCOUNTER — Emergency Department (HOSPITAL_COMMUNITY): Payer: Medicaid Other

## 2016-06-17 ENCOUNTER — Encounter (HOSPITAL_COMMUNITY): Payer: Self-pay | Admitting: Emergency Medicine

## 2016-06-17 ENCOUNTER — Emergency Department (HOSPITAL_COMMUNITY)
Admission: EM | Admit: 2016-06-17 | Discharge: 2016-06-17 | Disposition: A | Discharge status: Home / Self Care | Payer: Medicaid Other | Attending: Emergency Medicine | Admitting: Emergency Medicine

## 2016-06-17 DIAGNOSIS — D259 Leiomyoma of uterus, unspecified: Secondary | ICD-10-CM

## 2016-06-17 DIAGNOSIS — I10 Essential (primary) hypertension: Secondary | ICD-10-CM | POA: Diagnosis not present

## 2016-06-17 DIAGNOSIS — F17200 Nicotine dependence, unspecified, uncomplicated: Secondary | ICD-10-CM | POA: Insufficient documentation

## 2016-06-17 DIAGNOSIS — N938 Other specified abnormal uterine and vaginal bleeding: Secondary | ICD-10-CM

## 2016-06-17 DIAGNOSIS — Z7982 Long term (current) use of aspirin: Secondary | ICD-10-CM | POA: Diagnosis not present

## 2016-06-17 DIAGNOSIS — R1031 Right lower quadrant pain: Secondary | ICD-10-CM | POA: Diagnosis not present

## 2016-06-17 DIAGNOSIS — N939 Abnormal uterine and vaginal bleeding, unspecified: Secondary | ICD-10-CM | POA: Diagnosis present

## 2016-06-17 LAB — URINALYSIS, ROUTINE W REFLEX MICROSCOPIC: Squamous Epithelial / LPF: NONE SEEN

## 2016-06-17 LAB — CBC WITH DIFFERENTIAL/PLATELET
BASOS PCT: 0 %
Basophils Absolute: 0 10*3/uL (ref 0.0–0.1)
Eosinophils Absolute: 0.1 10*3/uL (ref 0.0–0.7)
Eosinophils Relative: 1 %
HEMATOCRIT: 38.3 % (ref 36.0–46.0)
Hemoglobin: 12.4 g/dL (ref 12.0–15.0)
LYMPHS ABS: 3.1 10*3/uL (ref 0.7–4.0)
Lymphocytes Relative: 34 %
MCH: 30.8 pg (ref 26.0–34.0)
MCHC: 32.4 g/dL (ref 30.0–36.0)
MCV: 95.3 fL (ref 78.0–100.0)
MONOS PCT: 7 %
Monocytes Absolute: 0.6 10*3/uL (ref 0.1–1.0)
NEUTROS ABS: 5.2 10*3/uL (ref 1.7–7.7)
NEUTROS PCT: 58 %
Platelets: 187 10*3/uL (ref 150–400)
RBC: 4.02 MIL/uL (ref 3.87–5.11)
RDW: 14 % (ref 11.5–15.5)
WBC: 8.9 10*3/uL (ref 4.0–10.5)

## 2016-06-17 LAB — WET PREP, GENITAL
Clue Cells Wet Prep HPF POC: NONE SEEN
SPERM: NONE SEEN
Trich, Wet Prep: NONE SEEN
WBC WET PREP: NONE SEEN
Yeast Wet Prep HPF POC: NONE SEEN

## 2016-06-17 LAB — BASIC METABOLIC PANEL
Anion gap: 8 (ref 5–15)
BUN: 8 mg/dL (ref 6–20)
CHLORIDE: 104 mmol/L (ref 101–111)
CO2: 26 mmol/L (ref 22–32)
CREATININE: 0.77 mg/dL (ref 0.44–1.00)
Calcium: 9.5 mg/dL (ref 8.9–10.3)
GFR calc non Af Amer: >60 mL/min (ref >60)
Glucose, Bld: 98 mg/dL (ref 65–99)
Potassium: 4.1 mmol/L (ref 3.5–5.1)
Sodium: 138 mmol/L (ref 135–145)

## 2016-06-17 LAB — POC URINE PREG, ED: Preg Test, Ur: NEGATIVE

## 2016-06-17 MED ORDER — IOPAMIDOL (ISOVUE-300) INJECTION 61%
INTRAVENOUS | Status: AC
  Administered 2016-06-17: 100 mL
  Filled 2016-06-17: qty 100

## 2016-06-17 MED ORDER — NAPROXEN 500 MG PO TABS: 500.0000 mg | ORAL_TABLET | Freq: Two times a day (BID) | ORAL | 0 refills | Status: AC

## 2016-06-17 NOTE — ED Triage Notes (Addendum)
Pt with RLQ abdominal pain with nausea x several days. Pt states she had episode of vaginal bleeding yesterday with clots.  Pt unsure if she is in menopause and states her last period was last year. Pt denies vomiting, diarrhea. C/o chills.

## 2016-06-17 NOTE — ED Notes (Signed)
Upon urine collection, pt had multiple blood clots in the urine distorting the color

## 2016-06-17 NOTE — ED Notes (Signed)
ED Provider at bedside. 

## 2016-06-17 NOTE — ED Provider Notes (Signed)
[facility] DEPT Provider Note   CSN: [id number] Arrival date & time: 06/17/16  1052     History   Chief Complaint Chief Complaint  Patient presents with  . Abdominal Pain  . Vaginal Bleeding    HPI Mckenzie Whitney is a 56 y.o. female.  HPI Mckenzie Whitney is a 56 y.o. female presents to emergency department complaining of abdominal pain and vaginal bleeding. Patient states she has had nausea for approximately 2 weeks. She states that yesterday she felt like there was a knot in her abdomen and developed severe right lower quadrant abdominal pain. She states today she started having vaginal bleeding. Reports bleeding is "gushing." Status and assess heavy as her period. Patient states her last menstrual cycle was one year ago. She continues to have abdominal pain. Denies any vomiting. No fever or chills. No urinary symptoms. No vaginal discharge otherwise. Did not take any medications prior to coming in. Nothing is making her symptoms better or worse. Describes pain as sharp.  Past Medical History:  Diagnosis Date  . Allergic rhinitis   . Depression   . High cholesterol   . Hypercholesteremia   . Hypertension   . Mood disorder ([place])     There are no active problems to display for this patient.   Past Surgical History:  Procedure Laterality Date  . CHOLECYSTECTOMY    . STERILIZATION    . TUBAL LIGATION      OB History    Gravida Para Term Preterm AB Living   0 0 0 0 0     SAB TAB Ectopic Multiple Live Births   0 0 0           Home Medications    Prior to Admission medications   Medication Sig Start Date End Date Taking? Authorizing Provider  aspirin EC 81 MG tablet Take 81 mg by mouth every evening.   Yes [provider]  celecoxib (CELEBREX) 200 MG capsule Take 200 mg by mouth 2 (two) times daily as needed for mild pain or moderate pain.    Yes [provider]  mometasone (NASONEX) 50 MCG/ACT nasal spray Place 2 sprays into the nose daily.    Yes [provider]  omega-3 acid ethyl esters (LOVAZA) 1 G capsule Take 1 g by mouth 4 (four) times daily.   Yes [provider]  dicyclomine (BENTYL) 20 MG tablet Take 1 tablet (20 mg total) by mouth 4 (four) times daily -  before meals and at bedtime. Patient not taking: Reported on 06/17/2016 02/26/16 03/04/16  Duffy Bruce, MD  loperamide (IMODIUM) 2 MG capsule Take 1 capsule (2 mg total) by mouth 4 (four) times daily as needed for diarrhea or loose stools. Patient not taking: Reported on 06/17/2016 02/26/16   Duffy Bruce, MD  ondansetron (ZOFRAN) 4 MG tablet Take 1 tablet (4 mg total) by mouth every 8 (eight) hours as needed for nausea or vomiting. Patient not taking: Reported on 06/17/2016 02/26/16   Duffy Bruce, MD    Family History History reviewed. No pertinent family history.  Social History Social History  Substance Use Topics  . Smoking status: Current Some Day Smoker  . Smokeless tobacco: Never Used  . Alcohol use Yes     Comment: occ     Allergies   Seroquel [quetiapine fumarate] and Tramadol   Review of Systems Review of Systems  Constitutional: Negative for chills and fever.  Respiratory: Negative for cough, chest tightness and shortness of breath.   Cardiovascular:  Negative for chest pain, palpitations and leg swelling.  Gastrointestinal: Positive for abdominal pain and nausea. Negative for diarrhea and vomiting.  Genitourinary: Positive for vaginal bleeding. Negative for dysuria, flank pain, pelvic pain, vaginal discharge and vaginal pain.  Musculoskeletal: Negative for arthralgias, myalgias, neck pain and neck stiffness.  Skin: Negative for rash.  Neurological: Negative for dizziness, weakness and headaches.  All other systems reviewed and are negative.    Physical Exam Updated Vital Signs BP 122/83 (BP Location: Right Arm)   Pulse 95   Temp 98.7 F (37.1 C) (Oral)   Resp 18   Ht 5' 4.75" (1.645 m)   Wt 69.9 kg (154 lb)   SpO2  97%   BMI 25.83 kg/m   Physical Exam  Constitutional: She appears well-developed and well-nourished. No distress.  HENT:  Head: Normocephalic.  Eyes: Conjunctivae are normal.  Neck: Neck supple.  Cardiovascular: Normal rate, regular rhythm and normal heart sounds.   Pulmonary/Chest: Effort normal and breath sounds normal. No respiratory distress. She has no wheezes. She has no rales.  Abdominal: Soft. Bowel sounds are normal. She exhibits no distension. There is tenderness. There is no rebound and no guarding.  Right lower quadrant tenderness  Genitourinary:  Genitourinary Comments: Normal external genitalia. Normal vaginal canal with some blood. No clots. Cervix is normal, closed. No cervical motion tenderness. No uterine tenderness. No adnexal tenderness  Musculoskeletal: She exhibits no edema.  Neurological: She is alert.  Skin: Skin is warm and dry.  Psychiatric: She has a normal mood and affect. Her behavior is normal.  Nursing note and vitals reviewed.    ED Treatments / Results  Labs (all labs ordered are listed, but only abnormal results are displayed) Labs Reviewed  URINALYSIS, ROUTINE W REFLEX MICROSCOPIC - Abnormal; Notable for the following:       Result Value   Color, Urine RED (*)    APPearance TURBID (*)    Glucose, UA   (*)    Value: TEST NOT REPORTED DUE TO COLOR INTERFERENCE OF URINE PIGMENT   Hgb urine dipstick   (*)    Value: TEST NOT REPORTED DUE TO COLOR INTERFERENCE OF URINE PIGMENT   Bilirubin Urine   (*)    Value: TEST NOT REPORTED DUE TO COLOR INTERFERENCE OF URINE PIGMENT   Ketones, ur   (*)    Value: TEST NOT REPORTED DUE TO COLOR INTERFERENCE OF URINE PIGMENT   Protein, ur   (*)    Value: TEST NOT REPORTED DUE TO COLOR INTERFERENCE OF URINE PIGMENT   Nitrite   (*)    Value: TEST NOT REPORTED DUE TO COLOR INTERFERENCE OF URINE PIGMENT   Leukocytes, UA   (*)    Value: TEST NOT REPORTED DUE TO COLOR INTERFERENCE OF URINE PIGMENT   Bacteria, UA  MANY (*)    All other components within normal limits  WET PREP, GENITAL  CBC WITH DIFFERENTIAL/PLATELET  BASIC METABOLIC PANEL  POC URINE PREG, ED  GC/CHLAMYDIA PROBE AMP ([place]) NOT AT [facility]    EKG  EKG Interpretation None       Radiology Ct Abdomen Pelvis W Contrast  Result Date: 06/17/2016 CLINICAL DATA:  Right lower quadrant pain and nausea times several days. Vaginal bleeding yesterday with clots. EXAM: CT ABDOMEN AND PELVIS WITH CONTRAST TECHNIQUE: Multidetector CT imaging of the abdomen and pelvis was performed using the standard protocol following bolus administration of intravenous contrast. CONTRAST:  197mL ISOVUE-300 IOPAMIDOL (ISOVUE-300) INJECTION 61% COMPARISON:  02/25/2014 FINDINGS:  Lower chest: Heart size is normal. There is bibasilar atelectasis. No pericardial effusion. No pulmonary consolidation or pneumothorax. Hepatobiliary: Hepatic steatosis. Status post cholecystectomy. No enhancing mass or biliary dilatation. Pancreas: Normal Spleen: Normal Adrenals/Urinary Tract: Normal bilateral adrenal glands. Small 8 mm hypodensity in the lower pole of the left kidney statistically consistent with a cyst but too small to characterize. No nephrolithiasis nor hydro ureteral nephrosis. Urinary bladder is unremarkable. Stomach/Bowel: The stomach is contracted. There is normal small bowel rotation. No mechanical bowel obstruction or acute inflammation is seen. Peristaltic change noted of a few segments of small intestine in the left hemiabdomen believed to account for slight areas of fluid-filled distention. The appendix is normal in appearance. There is scattered colonic diverticulosis without acute diverticulitis. Vascular/Lymphatic: Aortic atherosclerosis. No aneurysm. No lymphadenopathy. Reproductive: Heterogeneous enhancement of a mildly enlarged uterus with small posterofundal subserosal 1.7 cm fibroid noted. Smaller intramural fibroids are also seen on the left under reformats,  measuring up to 0.8 cm. The endometrium appears thin without displacement on the sagittal reformats. Other: No free air nor free fluid. Small fat containing umbilical hernia. Musculoskeletal: No acute or significant osseous findings. IMPRESSION: 1. Hepatic steatosis.  Status post cholecystectomy. 2. No acute bowel obstruction or inflammation.  Normal appendix. 3. Fibroid uterus without adnexal mass. 4. Tiny 8 mm cyst too small to further characterize in the lower pole of the left kidney. Electronically Signed   By: Ashley Royalty M.D.   On: 06/17/2016 14:25    Procedures Procedures (including critical care time)  Medications Ordered in ED Medications  iopamidol (ISOVUE-300) 61 % injection (100 mLs  Contrast Given 06/17/16 1355)     Initial Impression / Assessment and Plan / ED Course  I have reviewed the triage vital signs and the nursing notes.  Pertinent labs & imaging results that were available during my care of the patient were reviewed by me and considered in my medical decision making (see chart for details).     Patient in emergency department with right lower quadrant abdominal pain and vaginal bleeding. She reports history of nausea for the last 2 weeks. We will check labs, perform pelvic exam. Patient has gone through menopause and this bleeding is unusual for her.   Pelvic exam is unremarkable other than bleeding with no tenderness. Given pretty significant tenderness in the right lower quadrant will get CT abdomen and pelvis to rule out appendicitis and for further evaluation.  CT showed fibroid uterus, otherwise unremarkable. Urinalysis is highly contaminated with blood, since no urinary symptoms, I do not think we need to treat. Discussed results with patient. Patient states her pain is minimal and she does not want any pain medications. Will start on NSAIDs. She will follow with her OB/GYN doctor for further evaluation of dysfunctional uterine bleeding. Return precautions  discussed. Vital signs are normal. Patient stable for discharge home.  Vitals:   06/17/16 1108 06/17/16 1441  BP: 122/83 112/75  Pulse: 95 89  Resp: 18 17  Temp: 98.7 F (37.1 C)   TempSrc: Oral   SpO2: 97% 99%  Weight: 69.9 kg (154 lb)   Height: 5' 4.75" (1.645 m)      Final Clinical Impressions(s) / ED Diagnoses   Final diagnoses:  Dysfunctional uterine bleeding  Uterine leiomyoma, unspecified location    New Prescriptions Discharge Medication List as of 06/17/2016  2:50 PM    START taking these medications   Details  naproxen (NAPROSYN) 500 MG tablet Take 1 tablet (500 mg total)  by mouth 2 (two) times daily., Starting Thu 06/17/2016, Print         Marc Morgans [place], PA-C 06/17/16 1558    Quintella Reichert, MD 06/18/16 [phone]

## 2016-06-17 NOTE — Discharge Instructions (Signed)
Naprosyn for pain and inflammation. Follow up with OB/GYN for further evaluation and treatment.

## 2016-06-18 LAB — GC/CHLAMYDIA PROBE AMP (~~LOC~~) NOT AT ARMC
Chlamydia: NEGATIVE
Neisseria Gonorrhea: NEGATIVE

## 2017-05-19 ENCOUNTER — Other Ambulatory Visit: Payer: Self-pay

## 2017-05-19 ENCOUNTER — Emergency Department (HOSPITAL_COMMUNITY): Payer: Medicaid Other

## 2017-05-19 ENCOUNTER — Encounter (HOSPITAL_COMMUNITY): Payer: Self-pay | Admitting: Nurse Practitioner

## 2017-05-19 ENCOUNTER — Emergency Department (HOSPITAL_COMMUNITY)
Admission: EM | Admit: 2017-05-19 | Discharge: 2017-05-19 | Disposition: A | Discharge status: Home / Self Care | Payer: Medicaid Other | Attending: Emergency Medicine | Admitting: Emergency Medicine

## 2017-05-19 DIAGNOSIS — Z7982 Long term (current) use of aspirin: Secondary | ICD-10-CM | POA: Diagnosis not present

## 2017-05-19 DIAGNOSIS — R202 Paresthesia of skin: Secondary | ICD-10-CM | POA: Diagnosis not present

## 2017-05-19 DIAGNOSIS — G43109 Migraine with aura, not intractable, without status migrainosus: Secondary | ICD-10-CM | POA: Insufficient documentation

## 2017-05-19 DIAGNOSIS — I10 Essential (primary) hypertension: Secondary | ICD-10-CM | POA: Diagnosis not present

## 2017-05-19 DIAGNOSIS — H538 Other visual disturbances: Secondary | ICD-10-CM | POA: Diagnosis present

## 2017-05-19 DIAGNOSIS — R4781 Slurred speech: Secondary | ICD-10-CM | POA: Diagnosis not present

## 2017-05-19 DIAGNOSIS — F17200 Nicotine dependence, unspecified, uncomplicated: Secondary | ICD-10-CM | POA: Diagnosis not present

## 2017-05-19 LAB — BASIC METABOLIC PANEL
Anion gap: 12 (ref 5–15)
BUN: 10 mg/dL (ref 6–20)
CALCIUM: 10.1 mg/dL (ref 8.9–10.3)
CO2: 27 mmol/L (ref 22–32)
Chloride: 100 mmol/L — ABNORMAL LOW (ref 101–111)
Creatinine, Ser: 0.83 mg/dL (ref 0.44–1.00)
GFR calc Af Amer: >60 mL/min (ref >60)
GLUCOSE: 135 mg/dL — AB (ref 65–99)
POTASSIUM: 5 mmol/L (ref 3.5–5.1)
SODIUM: 139 mmol/L (ref 135–145)

## 2017-05-19 LAB — CBC WITH DIFFERENTIAL/PLATELET
Basophils Absolute: 0 10*3/uL (ref 0.0–0.1)
Basophils Relative: 0 %
Eosinophils Absolute: 0.1 10*3/uL (ref 0.0–0.7)
Eosinophils Relative: 1 %
HCT: 43.6 % (ref 36.0–46.0)
HEMOGLOBIN: 14.4 g/dL (ref 12.0–15.0)
LYMPHS ABS: 4.3 10*3/uL — AB (ref 0.7–4.0)
LYMPHS PCT: 42 %
MCH: 31.8 pg (ref 26.0–34.0)
MCHC: 33 g/dL (ref 30.0–36.0)
MCV: 96.2 fL (ref 78.0–100.0)
MONOS PCT: 6 %
Monocytes Absolute: 0.6 10*3/uL (ref 0.1–1.0)
NEUTROS ABS: 5.2 10*3/uL (ref 1.7–7.7)
NEUTROS PCT: 51 %
Platelets: 202 10*3/uL (ref 150–400)
RBC: 4.53 MIL/uL (ref 3.87–5.11)
RDW: 13.8 % (ref 11.5–15.5)
WBC: 10.1 10*3/uL (ref 4.0–10.5)

## 2017-05-19 NOTE — ED Provider Notes (Signed)
[facility] DEPT Provider Note   CSN: [id number] Arrival date & time: 05/19/17  1658     History   Chief Complaint No chief complaint on file.   HPI Mckenzie Whitney is a 57 y.o. female.  57 yo F with a chief complaint of diffuse tingling blurred vision slurred speech and pupillary defect.  This occurred all day yesterday.  She had a slow onset worsening right frontal headache.  Has had headaches like this in the past.  Has never had the associated symptoms.  When she looked in the mirror she noted that her one pupil was larger than the other.  She denies head injury.  Today she woke up and her symptoms had significantly improved and then resolved.  The history is provided by the patient.  Illness  This is a new problem. The current episode started yesterday. The problem occurs constantly. The problem has been resolved. Associated symptoms include headaches. Pertinent negatives include no chest pain and no shortness of breath. Nothing aggravates the symptoms. Nothing relieves the symptoms. She has tried nothing for the symptoms. The treatment provided no relief.    Past Medical History:  Diagnosis Date  . Allergic rhinitis   . Depression   . High cholesterol   . Hypercholesteremia   . Hypertension   . Mood disorder ([place])     There are no active problems to display for this patient.   Past Surgical History:  Procedure Laterality Date  . CHOLECYSTECTOMY    . STERILIZATION    . TUBAL LIGATION       OB History    Gravida  0   Para  0   Term  0   Preterm  0   AB  0   Living        SAB  0   TAB  0   Ectopic  0   Multiple      Live Births               Home Medications    Prior to Admission medications   Medication Sig Start Date End Date Taking? Authorizing Provider  aspirin EC 81 MG tablet Take 81 mg by mouth every evening.   Yes [provider]  omega-3 acid ethyl esters (LOVAZA) 1 g capsule Take 1 g by  mouth daily.   Yes [provider]  dicyclomine (BENTYL) 20 MG tablet Take 1 tablet (20 mg total) by mouth 4 (four) times daily -  before meals and at bedtime. Patient not taking: Reported on 06/17/2016 02/26/16 03/04/16  Duffy Bruce, MD  loperamide (IMODIUM) 2 MG capsule Take 1 capsule (2 mg total) by mouth 4 (four) times daily as needed for diarrhea or loose stools. Patient not taking: Reported on 06/17/2016 02/26/16   Duffy Bruce, MD  naproxen (NAPROSYN) 500 MG tablet Take 1 tablet (500 mg total) by mouth 2 (two) times daily. Patient not taking: Reported on 05/19/2017 06/17/16   Jeannett Senior, PA-C  ondansetron (ZOFRAN) 4 MG tablet Take 1 tablet (4 mg total) by mouth every 8 (eight) hours as needed for nausea or vomiting. Patient not taking: Reported on 06/17/2016 02/26/16   Duffy Bruce, MD    Family History History reviewed. No pertinent family history.  Social History Social History   Tobacco Use  . Smoking status: Current Some Day Smoker  . Smokeless tobacco: Never Used  Substance Use Topics  . Alcohol use: Yes    Comment: occ  . Drug  use: Yes    Types: Marijuana     Allergies   Seroquel [quetiapine fumarate] and Tramadol   Review of Systems Review of Systems  Constitutional: Negative for chills and fever.  HENT: Negative for congestion and rhinorrhea.   Eyes: Negative for redness and visual disturbance.  Respiratory: Negative for shortness of breath and wheezing.   Cardiovascular: Negative for chest pain and palpitations.  Gastrointestinal: Negative for nausea and vomiting.  Genitourinary: Negative for dysuria and urgency.  Musculoskeletal: Negative for arthralgias and myalgias.  Skin: Negative for pallor and wound.  Neurological: Positive for weakness and headaches. Negative for dizziness.     Physical Exam Updated Vital Signs BP (!) 125/93 (BP Location: Right Arm)   Pulse 76   Temp 98.6 F (37 C) (Oral)   Resp 18   Ht 5\' 4"  (1.626 m)    Wt 67.1 kg (148 lb)   SpO2 100%   BMI 25.40 kg/m   Physical Exam  Constitutional: She is oriented to person, place, and time. She appears well-developed and well-nourished. No distress.  HENT:  Head: Normocephalic and atraumatic.  Eyes: Pupils are equal, round, and reactive to light. EOM are normal.  Neck: Normal range of motion. Neck supple.  Cardiovascular: Normal rate and regular rhythm. Exam reveals no gallop and no friction rub.  No murmur heard. Pulmonary/Chest: Effort normal. She has no wheezes. She has no rales.  Abdominal: Soft. She exhibits no distension. There is no tenderness.  Musculoskeletal: She exhibits no edema or tenderness.  Neurological: She is alert and oriented to person, place, and time. She has normal strength. No cranial nerve deficit or sensory deficit. She displays a negative Romberg sign. Coordination and gait normal.  Benign neuro exam  Skin: Skin is warm and dry. She is not diaphoretic.  Psychiatric: She has a normal mood and affect. Her behavior is normal.  Nursing note and vitals reviewed.    ED Treatments / Results  Labs (all labs ordered are listed, but only abnormal results are displayed) Labs Reviewed  CBC WITH DIFFERENTIAL/PLATELET - Abnormal; Notable for the following components:      Result Value   Lymphs Abs 4.3 (*)    All other components within normal limits  BASIC METABOLIC PANEL - Abnormal; Notable for the following components:   Chloride 100 (*)    Glucose, Bld 135 (*)    All other components within normal limits    EKG EKG Interpretation  Date/Time:  Thursday May 19 2017 17:09:00 EDT Ventricular Rate:  73 PR Interval:    QRS Duration: 99 QT Interval:  369 QTC Calculation: 407 R Axis:   66 Text Interpretation:  Sinus rhythm No significant change since last tracing Confirmed by Deno Etienne [phone]) on 05/19/2017 9:43:13 PM   Radiology Ct Head Wo Contrast  Result Date: 05/19/2017 CLINICAL DATA:  Enlarged right pupil dizzy  EXAM: CT HEAD WITHOUT CONTRAST TECHNIQUE: Contiguous axial images were obtained from the base of the skull through the vertex without intravenous contrast. COMPARISON:  None. FINDINGS: Brain: No evidence of acute infarction, hemorrhage, hydrocephalus, extra-axial collection or mass lesion/mass effect. Vascular: No hyperdense vessels. Scattered calcifications at the carotid siphon Skull: Normal. Negative for fracture or focal lesion. Sinuses/Orbits: No acute finding. Other: None IMPRESSION: Negative non contrasted CT appearance of the brain Electronically Signed   By: Donavan Foil M.D.   On: 05/19/2017 21:50    Procedures Procedures (including critical care time)  Medications Ordered in ED Medications - No data to display  Initial Impression / Assessment and Plan / ED Course  I have reviewed the triage vital signs and the nursing notes.  Pertinent labs & imaging results that were available during my care of the patient were reviewed by me and considered in my medical decision making (see chart for details).     57 yo F with a chief complaint of diffuse numbness in his [place] Pane and slurred speech.  This occurred yesterday and then resolved.  I discussed the case with neurology, Dr. Leonel Ramsay he felt most likely that this was a complicated migraine he did not feel that it warranted further work-up for possible TIA.  As the patient does not have a neurologist I will refer her to one as an outpatient.  10:36 PM:  I have discussed the diagnosis/risks/treatment options with the patient and family and believe the pt to be eligible for discharge home to follow-up with Neuro. We also discussed returning to the ED immediately if new or worsening sx occur. We discussed the sx which are most concerning (e.g., stroke s/sx) that necessitate immediate return. Medications administered to the patient during their visit and any new prescriptions provided to the patient are listed below.  Medications given  during this visit Medications - No data to display    The patient appears reasonably screen and/or stabilized for discharge and I doubt any other medical condition or other [facility] requiring further screening, evaluation, or treatment in the ED at this time prior to discharge.    Final Clinical Impressions(s) / ED Diagnoses   Final diagnoses:  Complicated migraine    ED Discharge Orders        Ordered    Ambulatory referral to Neurology    Comments:  Likely complicated migraine   45/99/77 [place], [place], DO 05/19/17 2236

## 2017-05-19 NOTE — ED Triage Notes (Signed)
Yesterday morning she was cold and her right pupil was enlarged. Patient states her speak was slurred and her whole body both side became weak. Today she feels a little better but she isnt back to her baseline. Patient has been dizzy and her eye sight in both eyes were blurry.

## 2017-05-20 ENCOUNTER — Encounter: Payer: Self-pay | Admitting: Neurology

## 2017-08-01 ENCOUNTER — Ambulatory Visit: Payer: Self-pay | Admitting: Neurology

## 2018-05-05 IMAGING — CT CT ABD-PELV W/ CM
2 of 5 series · 16 of 46 positions shown, 18 images · IV contrast (ISOVUE)
Comparison: 02/25/2014

CLINICAL DATA: Right lower quadrant pain and nausea times several
days. Vaginal bleeding yesterday with clots.

EXAM:
CT ABDOMEN AND PELVIS WITH CONTRAST
TECHNIQUE: Multidetector CT imaging of the abdomen and pelvis was performed
using the standard protocol following bolus administration of
intravenous contrast.
CONTRAST:  100mL TNP949-LNN IOPAMIDOL (TNP949-LNN) INJECTION 61%

[Series 2: abd/pel with · axial · 0.71mm/px · z∈[-749,-379]mm · 13 of 86 slices shown, 15 images]
[im 6/86  soft-tissue]
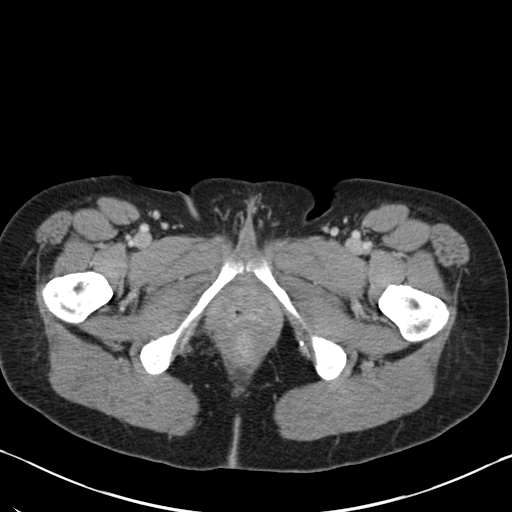
[im 6/86  bone]
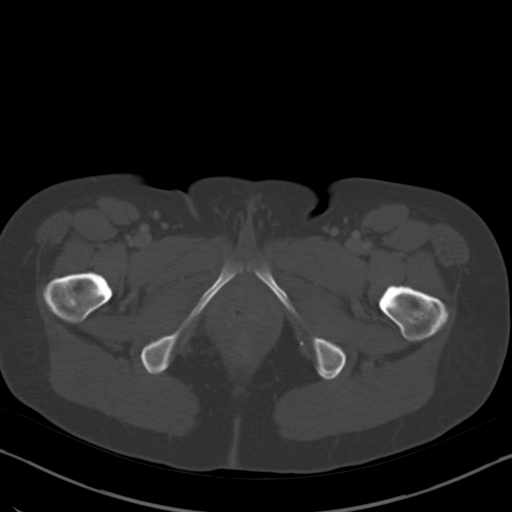
[im 11/86  soft-tissue]
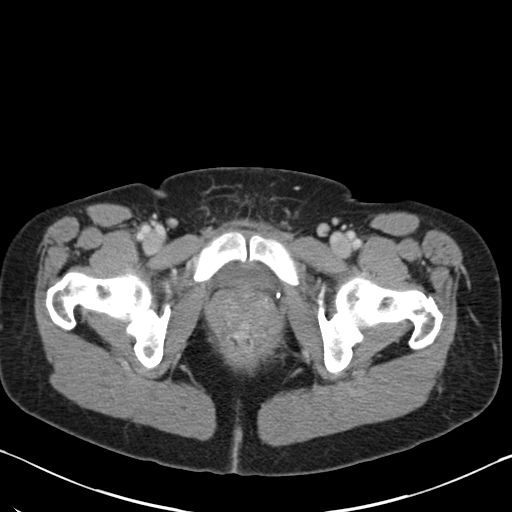
[im 16/86  soft-tissue]
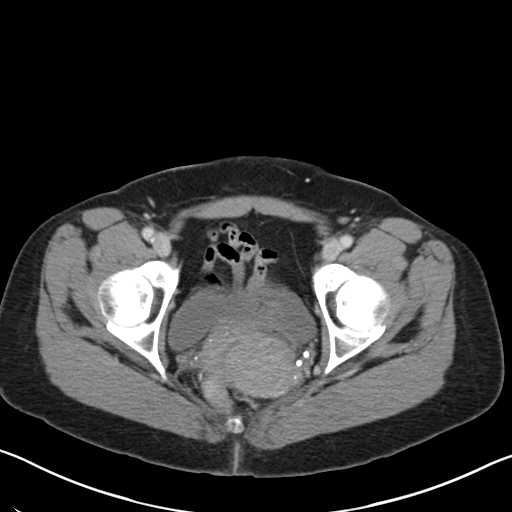
[im 27/86  soft-tissue]
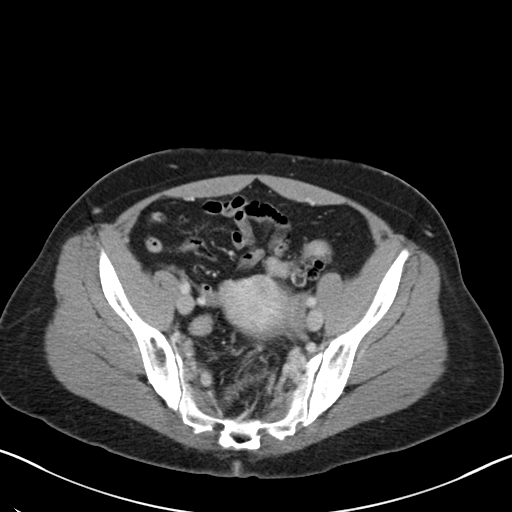
[im 32/86  soft-tissue]
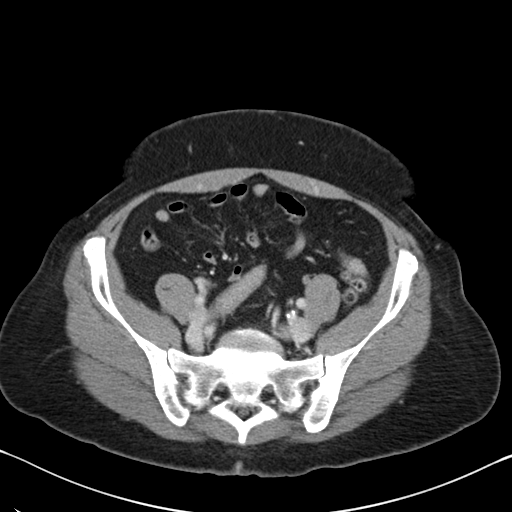
[im 38/86  soft-tissue]
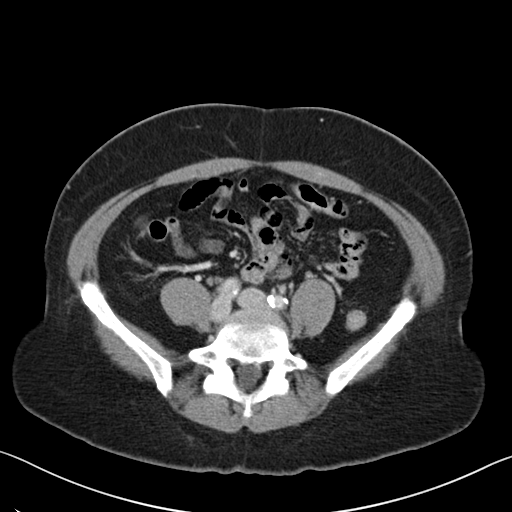
[im 43/86  soft-tissue]
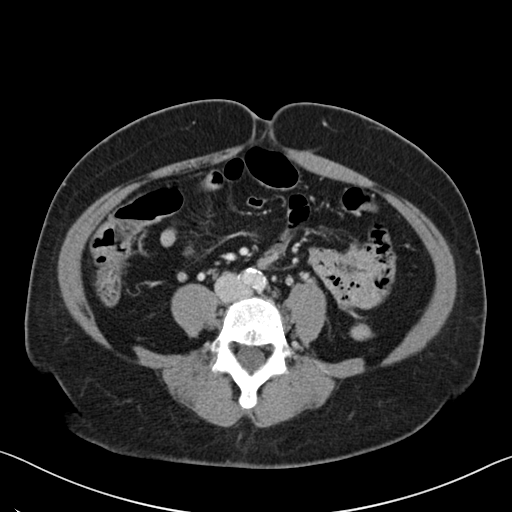
[im 48/86  soft-tissue]
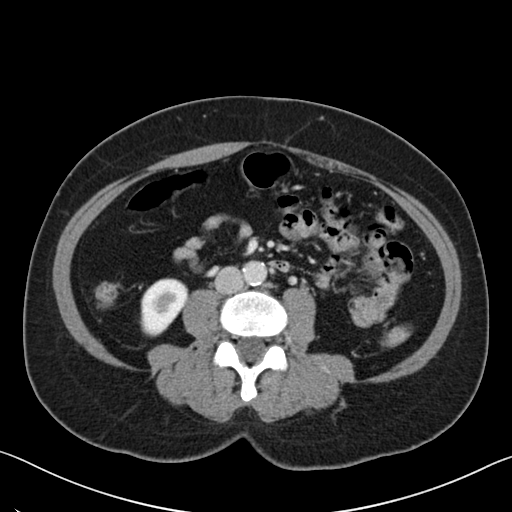
[im 54/86  soft-tissue]
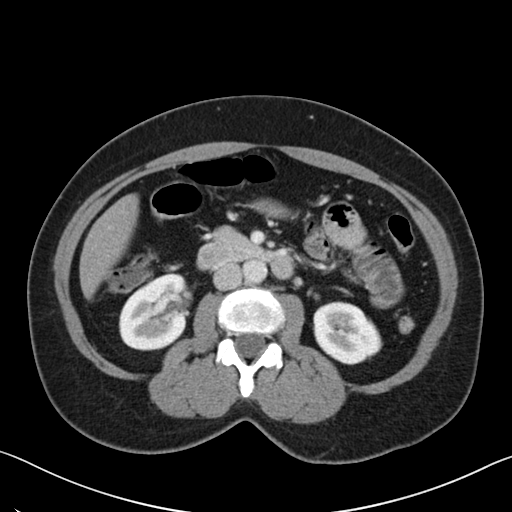
[im 54/86  bone]
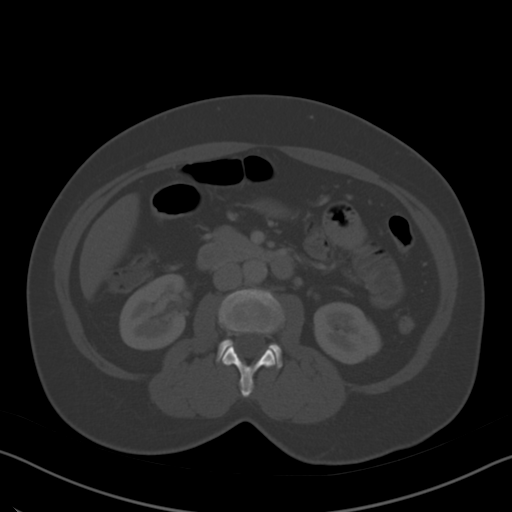
[im 59/86  soft-tissue]
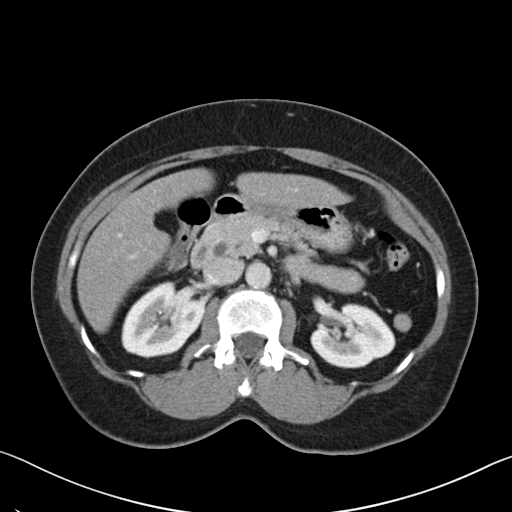
[im 70/86  soft-tissue]
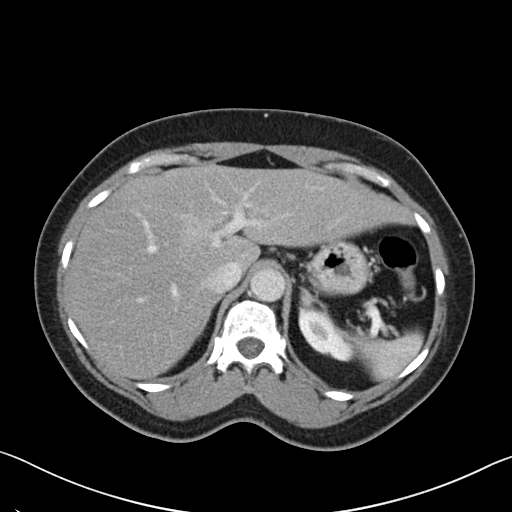
[im 75/86  soft-tissue]
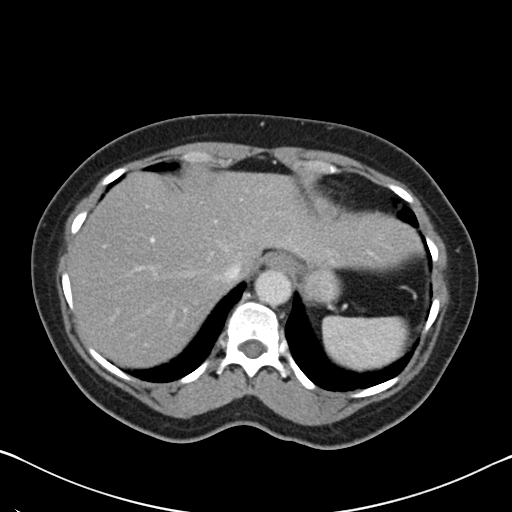
[im 80/86  soft-tissue]
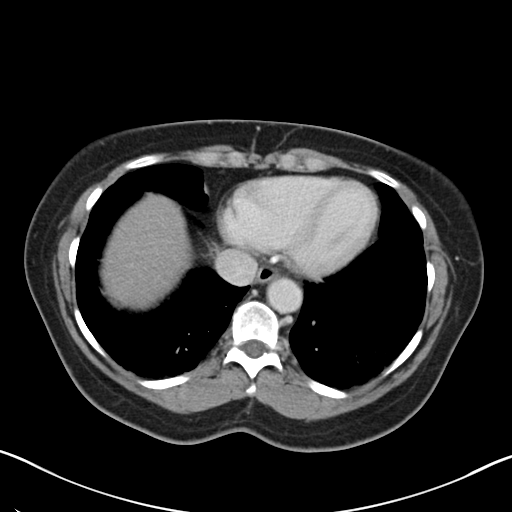

[Series 3: coronal a/|p · coronal · 0.82mm/px · 3 of 117 slices shown]
[im 39/117  soft-tissue]
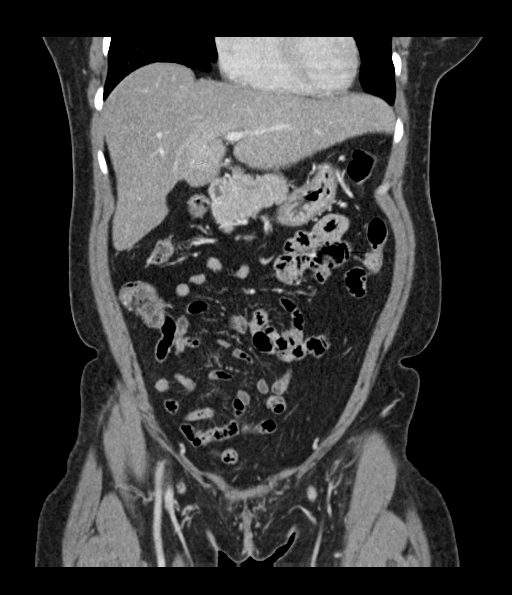
[im 52/117  soft-tissue]
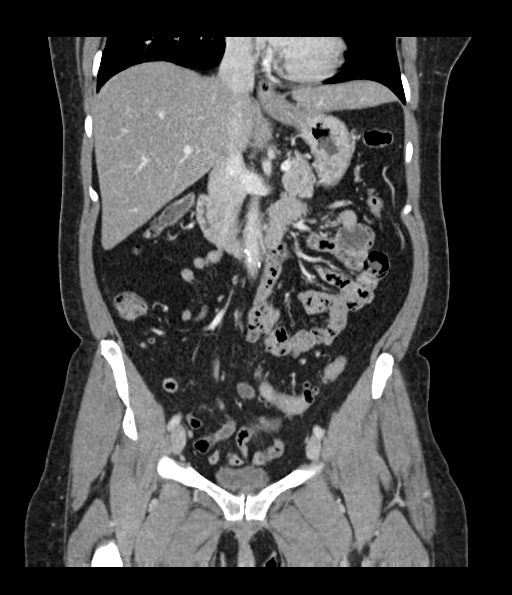
[im 65/117  soft-tissue]
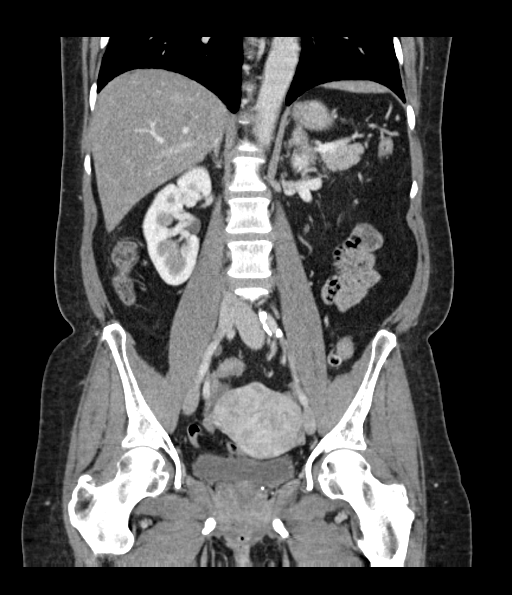

[16 of 46 positions shown; findings below may reference images not displayed]

FINDINGS: Lower chest: Heart size is normal. There is bibasilar atelectasis.
No pericardial effusion. No pulmonary consolidation or pneumothorax.

Hepatobiliary: Hepatic steatosis. Status post cholecystectomy. No
enhancing mass or biliary dilatation.

Pancreas: Normal

Spleen: Normal

Adrenals/Urinary Tract: Normal bilateral adrenal glands. Small 8 mm
hypodensity in the lower pole of the left kidney statistically
consistent with a cyst but too small to characterize. No
nephrolithiasis nor hydro ureteral nephrosis. Urinary bladder is
unremarkable.

Stomach/Bowel: The stomach is contracted. There is normal small
bowel rotation. No mechanical bowel obstruction or acute
inflammation is seen. Peristaltic change noted of a few segments of
small intestine in the left hemiabdomen believed to account for
slight areas of fluid-filled distention. The appendix is normal in
appearance. There is scattered colonic diverticulosis without acute
diverticulitis.

Vascular/Lymphatic: Aortic atherosclerosis. No aneurysm. No
lymphadenopathy.

Reproductive: Heterogeneous enhancement of a mildly enlarged uterus
with small posterofundal subserosal 1.7 cm fibroid noted. Smaller
intramural fibroids are also seen on the left under reformats,
measuring up to 0.8 cm. The endometrium appears thin without
displacement on the sagittal reformats.

Other: No free air nor free fluid. Small fat containing umbilical
hernia.

Musculoskeletal: No acute or significant osseous findings.
IMPRESSION: 1. Hepatic steatosis.  Status post cholecystectomy.
2. No acute bowel obstruction or inflammation.  Normal appendix.
3. Fibroid uterus without adnexal mass.
4. Tiny 8 mm cyst too small to further characterize in the lower
pole of the left kidney.
